# Patient Record
Sex: Female | Born: 1980 | Race: White | Hispanic: No | Marital: Married | Smoking: Never smoker
Health system: Southern US, Community
[De-identification: ages and names within clinical notes are randomized; demographics above are authoritative.]

## PROBLEM LIST (undated history)

## (undated) DIAGNOSIS — N911 Secondary amenorrhea: Secondary | ICD-10-CM

## (undated) DIAGNOSIS — N926 Irregular menstruation, unspecified: Secondary | ICD-10-CM

## (undated) DIAGNOSIS — A6000 Herpesviral infection of urogenital system, unspecified: Secondary | ICD-10-CM

## (undated) HISTORY — DX: Herpesviral infection of urogenital system, unspecified: A60.00

## (undated) HISTORY — PX: APPENDECTOMY: SHX54

---

## 2005-03-28 DIAGNOSIS — A6000 Herpesviral infection of urogenital system, unspecified: Secondary | ICD-10-CM

## 2005-03-28 HISTORY — DX: Herpesviral infection of urogenital system, unspecified: A60.00

## 2012-07-27 ENCOUNTER — Ambulatory Visit (INDEPENDENT_AMBULATORY_CARE_PROVIDER_SITE_OTHER): Payer: BC Managed Care – PPO | Admitting: Obstetrics & Gynecology

## 2012-07-27 ENCOUNTER — Other Ambulatory Visit: Payer: Self-pay | Admitting: Obstetrics & Gynecology

## 2012-07-27 ENCOUNTER — Encounter: Payer: Self-pay | Admitting: Obstetrics & Gynecology

## 2012-07-27 VITALS — BP 111/70 | HR 49 | Resp 14 | Ht 68.0 in | Wt 119.0 lb

## 2012-07-27 DIAGNOSIS — Z1329 Encounter for screening for other suspected endocrine disorder: Secondary | ICD-10-CM

## 2012-07-27 DIAGNOSIS — Z01419 Encounter for gynecological examination (general) (routine) without abnormal findings: Secondary | ICD-10-CM

## 2012-07-27 DIAGNOSIS — IMO0002 Reserved for concepts with insufficient information to code with codable children: Secondary | ICD-10-CM

## 2012-07-27 DIAGNOSIS — Z0000 Encounter for general adult medical examination without abnormal findings: Secondary | ICD-10-CM

## 2012-07-27 DIAGNOSIS — Z130 Encounter for screening for diseases of the blood and blood-forming organs and certain disorders involving the immune mechanism: Secondary | ICD-10-CM

## 2012-07-27 LAB — CBC WITH DIFFERENTIAL/PLATELET
Eosinophils Absolute: 0 10*3/uL (ref 0.0–0.7)
Hemoglobin: 13.3 g/dL (ref 12.0–15.0)
Lymphocytes Relative: 44 % (ref 12–46)
Lymphs Abs: 1.7 10*3/uL (ref 0.7–4.0)
MCH: 30.9 pg (ref 26.0–34.0)
Monocytes Relative: 9 % (ref 3–12)
Neutro Abs: 1.8 10*3/uL (ref 1.7–7.7)
Neutrophils Relative %: 45 % (ref 43–77)
Platelets: 206 10*3/uL (ref 150–400)
RBC: 4.3 MIL/uL (ref 3.87–5.11)
WBC: 3.8 10*3/uL — ABNORMAL LOW (ref 4.0–10.5)

## 2012-07-27 LAB — LIPID PANEL
Cholesterol: 125 mg/dL (ref 0–200)
HDL: 58 mg/dL (ref >39)
Total CHOL/HDL Ratio: 2.2 Ratio

## 2012-07-27 LAB — COMPREHENSIVE METABOLIC PANEL
Albumin: 4.2 g/dL (ref 3.5–5.2)
Alkaline Phosphatase: 44 U/L (ref 39–117)
BUN: 11 mg/dL (ref 6–23)
CO2: 33 mEq/L — ABNORMAL HIGH (ref 19–32)
Glucose, Bld: 87 mg/dL (ref 70–99)
Potassium: 4.3 mEq/L (ref 3.5–5.3)
Total Protein: 6.3 g/dL (ref 6.0–8.3)

## 2012-07-27 LAB — TSH: TSH: 1.149 u[IU]/mL (ref 0.350–4.500)

## 2012-07-27 MED ORDER — VALACYCLOVIR HCL 1 G PO TABS: 1000.0000 mg | ORAL_TABLET | Freq: Every day | ORAL | Status: DC

## 2012-07-27 NOTE — Progress Notes (Signed)
Subjective:     Gail Rhodes is a 32 y.o. female who presents for an annual exam. She wants to be pregnant and has been having unprotected sex for at least 10 months. Her periods are about 40 days apart and her husband was in a different location for 6 months. They are now in the same house. She would like valtrex and screening lipids and pap. The patient is sexually active. GYN screening history: last pap: was normal. The patient wears seatbelts: yes. The patient participates in regular exercise: yes. Has the patient ever been transfused or tattooed?: yes. The patient reports that there is not domestic violence in her life.   Menstrual History: OB History   Grav Para Term Preterm Abortions TAB SAB Ect Mult Living   0 0 0 0 0 0 0 0 0 0       Menarche age: 35  Patient's last menstrual period was 06/27/2012.    The following portions of the patient's history were reviewed and updated as appropriate: allergies, current medications, past family history, past medical history, past social history, past surgical history and problem list.  Review of Systems A comprehensive review of systems was negative. She has been married for 2/1/2 years, denies dyspareunia. She works for [employer], Insurance account manager. She weighed 175# in 2002.   Objective:    BP 111/70  Pulse 49  Resp 14  Ht 5\' 8"  (1.727 m)  Wt 119 lb (53.978 kg)  BMI 18.1 kg/m2  LMP 06/27/2012  General Appearance:    Alert, cooperative, no distress, appears stated age  Head:    Normocephalic, without obvious abnormality, atraumatic  Eyes:    PERRL, conjunctiva/corneas clear, EOM's intact, fundi    benign, both eyes  Ears:    Normal TM's and external ear canals, both ears  Nose:   Nares normal, septum midline, mucosa normal, no drainage    or sinus tenderness  Throat:   Lips, mucosa, and tongue normal; teeth and gums normal  Neck:   Supple, symmetrical, trachea midline, no adenopathy;    thyroid:  no enlargement/tenderness/nodules; no  carotid   bruit or JVD  Back:     Symmetric, no curvature, ROM normal, no CVA tenderness  Lungs:     Clear to auscultation bilaterally, respirations unlabored  Chest Wall:    No tenderness or deformity   Heart:    Regular rate and rhythm, S1 and S2 normal, no murmur, rub   or gallop  Breast Exam:    No tenderness, masses, or nipple abnormality  Abdomen:     Soft, non-tender, bowel sounds active all four quadrants,    no masses, no organomegaly  Genitalia:    Normal female without lesion, discharge or tenderness,     Extremities:   Extremities normal, atraumatic, no cyanosis or edema  Pulses:   2+ and symmetric all extremities  Skin:   Skin color, texture, turgor normal, no rashes or lesions  Lymph nodes:   Cervical, supraclavicular, and axillary nodes normal  Neurologic:   CNII-XII intact, normal strength, sensation and reflexes    throughout  .    Assessment:    Healthy female exam.  Desire for pregnancy   Plan:     Thin prep Pap smear.  Screening labs

## 2012-07-31 LAB — PAP IG, CT-NG NAA, HPV HIGH-RISK: HPV DNA High Risk: NOT DETECTED

## 2012-08-06 ENCOUNTER — Ambulatory Visit (INDEPENDENT_AMBULATORY_CARE_PROVIDER_SITE_OTHER): Payer: BC Managed Care – PPO | Admitting: Obstetrics & Gynecology

## 2012-08-06 ENCOUNTER — Encounter: Payer: Self-pay | Admitting: Obstetrics & Gynecology

## 2012-08-06 DIAGNOSIS — N970 Female infertility associated with anovulation: Secondary | ICD-10-CM

## 2012-08-06 MED ORDER — CLOMIPHENE CITRATE 50 MG PO TABS: 50.0000 mg | ORAL_TABLET | Freq: Every day | ORAL | Status: DC

## 2012-08-06 MED ORDER — ACYCLOVIR 200 MG PO CAPS: 200.0000 mg | ORAL_CAPSULE | Freq: Every day | ORAL | Status: DC

## 2012-08-06 MED ORDER — MEDROXYPROGESTERONE ACETATE 10 MG PO TABS: 10.0000 mg | ORAL_TABLET | Freq: Every day | ORAL | Status: DC

## 2012-08-06 NOTE — Progress Notes (Signed)
  Subjective:    Patient ID: Gail Rhodes, female    DOB: 04-04-1980, 32 y.o.   MRN: [id number]  HPI  Gail Rhodes is a 32 yo MW G0 who would like a pregnancy. Her cycles are now at least 40 days long. She is here to discuss clomid.  Review of Systems     Objective:   Physical Exam        Assessment & Plan:  oligoovulation- I have discussed and offered clomid. She is aware that there is an increased risk of twins. She declines a referral to a RE at this time. If no pregnancy in 3 months, I will check a semen analysis. She will take provera if she does not start a period this month (if a pregnancy test is negative).

## 2012-09-11 ENCOUNTER — Ambulatory Visit: Payer: BC Managed Care – PPO | Admitting: Obstetrics & Gynecology

## 2012-12-04 ENCOUNTER — Encounter: Payer: BC Managed Care – PPO | Admitting: Obstetrics & Gynecology

## 2012-12-04 ENCOUNTER — Emergency Department: Payer: Self-pay | Admitting: Emergency Medicine

## 2012-12-04 LAB — LIPASE, BLOOD: Lipase: 144 U/L (ref 73–393)

## 2012-12-04 LAB — URINALYSIS, COMPLETE
Bilirubin,UR: NEGATIVE
Glucose,UR: NEGATIVE mg/dL (ref 0–75)
Ketone: NEGATIVE
Leukocyte Esterase: NEGATIVE
Ph: 8 (ref 4.5–8.0)
RBC,UR: 2 /HPF (ref 0–5)
Squamous Epithelial: NONE SEEN
WBC UR: NONE SEEN /HPF (ref 0–5)

## 2012-12-04 LAB — COMPREHENSIVE METABOLIC PANEL
Anion Gap: 3 — ABNORMAL LOW (ref 7–16)
BUN: 9 mg/dL (ref 7–18)
Bilirubin,Total: 0.5 mg/dL (ref 0.2–1.0)
Chloride: 106 mmol/L (ref 98–107)
Co2: 30 mmol/L (ref 21–32)
Creatinine: 0.77 mg/dL (ref 0.60–1.30)
EGFR (African American): >60
Glucose: 88 mg/dL (ref 65–99)
Potassium: 4 mmol/L (ref 3.5–5.1)
SGOT(AST): 22 U/L (ref 15–37)
SGPT (ALT): 23 U/L (ref 12–78)

## 2012-12-04 LAB — CBC
HCT: 37.9 % (ref 35.0–47.0)
MCV: 94 fL (ref 80–100)
Platelet: 168 10*3/uL (ref 150–440)
RBC: 4.01 10*6/uL (ref 3.80–5.20)

## 2012-12-06 ENCOUNTER — Other Ambulatory Visit (INDEPENDENT_AMBULATORY_CARE_PROVIDER_SITE_OTHER): Payer: BC Managed Care – PPO | Admitting: *Deleted

## 2012-12-06 ENCOUNTER — Other Ambulatory Visit: Payer: BC Managed Care – PPO

## 2012-12-06 DIAGNOSIS — O021 Missed abortion: Secondary | ICD-10-CM

## 2012-12-06 NOTE — Progress Notes (Signed)
Patient was seen at [facility] with a HCG quant level of 22.  She is still actively bleeding and was diagnosed as having a miscarriage and advised to follow up here for follow up beta quants.  She was told she had a cyst on her left ovary and still feels some discomfort from this but does not wish to make an appointment with the physician at this time due to the cost of the appointments.  She will monitor how she feels and we will call her tomorrow with her hcg level to see if she needs further blood work.  Patient has several questions regarding when to try to begin to conceive.  I advised her we recommend to wait 3 cycles to ensure the uterine lining is thickened enough to support a pregnancy.  She does not want to wait and feels like the clomid may have caused her to have this miscarriage.  I reassured her that this was not so but she said she would consider whether or not to take it again and states that she does not wish to wait 3 months to begin trying to conceive again.  I stressed to her that not waiting does increase her chances of miscarriage.  An appointment with a physician was once again offered and patient declines.  She will await results and call back if symptoms change or worsen.

## 2012-12-07 LAB — HCG, QUANTITATIVE, PREGNANCY: hCG, Beta Chain, Quant, S: 14 m[IU]/mL

## 2012-12-19 ENCOUNTER — Other Ambulatory Visit (INDEPENDENT_AMBULATORY_CARE_PROVIDER_SITE_OTHER): Payer: BC Managed Care – PPO | Admitting: *Deleted

## 2012-12-19 DIAGNOSIS — O200 Threatened abortion: Secondary | ICD-10-CM

## 2012-12-20 LAB — HCG, QUANTITATIVE, PREGNANCY: hCG, Beta Chain, Quant, S: <2 m[IU]/mL

## 2013-01-21 ENCOUNTER — Telehealth: Payer: Self-pay | Admitting: *Deleted

## 2013-01-21 NOTE — Telephone Encounter (Signed)
Patient has questions regarding she is starting back on her clomid as her cycle has returned and she is ready to try to conceive again.  She is reassured and will call us as soon as she has a positive pregnancy test to be seen for labs, including progesterone levels.

## 2013-01-31 ENCOUNTER — Other Ambulatory Visit: Payer: Self-pay

## 2013-02-05 ENCOUNTER — Encounter: Payer: Self-pay | Admitting: Obstetrics & Gynecology

## 2013-02-12 ENCOUNTER — Institutional Professional Consult (permissible substitution): Payer: BC Managed Care – PPO | Admitting: Obstetrics & Gynecology

## 2013-02-14 NOTE — Telephone Encounter (Signed)
Patient will be unable to come in to be seen until a couple weeks after thanksgiving.  She wants to know if she can take one more round of the Clomid at 50mg  dose until she can get in to be seen. This is regulating her cycle well and she did get pregnant on the first round but miscarried.  She says she will call back to set up an appointment after thanksgiving.

## 2013-02-18 ENCOUNTER — Telehealth: Payer: Self-pay | Admitting: *Deleted

## 2013-02-18 MED ORDER — CLOMIPHENE CITRATE 50 MG PO TABS: 50.0000 mg | ORAL_TABLET | Freq: Every day | ORAL | Status: DC

## 2013-02-18 NOTE — Telephone Encounter (Signed)
Patient requests one refill of clomid.  Dr. Marice Potter authorized one more refill but will not authorize any further refills for patient.  She will need to come into the office to discuss further treatment if this cycle does not take.  Patient understands and will call back to schedule either new ob or infertility appointment.

## 2013-03-28 NOTE — L&D Delivery Note (Signed)
Delivery Note At 9:59 PM a viable female was delivered via Vaginal, Spontaneous Delivery (Presentation: Left Occiput Anterior).  APGAR: 9, 9; weight  .   Placenta status: Intact, Spontaneous.  Cord: 3 vessels with the following complications: None.  Cord pH: not sent  Anesthesia: Epidural  Episiotomy: None Lacerations:  Sec deg + bilat superficial periurethral lac Suture Repair: 3.0 vicryl rapide Est. Blood Loss (mL):  400  Mom to postpartum.  Baby to Couplet care / Skin to Skin.  [name],[name] M 02/11/2014, 10:26 PM

## 2013-04-15 ENCOUNTER — Other Ambulatory Visit (HOSPITAL_COMMUNITY): Payer: Self-pay | Admitting: Gynecology

## 2013-04-15 DIAGNOSIS — Z3141 Encounter for fertility testing: Secondary | ICD-10-CM

## 2013-04-23 ENCOUNTER — Ambulatory Visit (HOSPITAL_COMMUNITY)
Admission: RE | Admit: 2013-04-23 | Discharge: 2013-04-23 | Disposition: A | Discharge status: Home / Self Care | Payer: BC Managed Care – PPO | Source: Ambulatory Visit | Attending: Gynecology | Admitting: Gynecology

## 2013-04-23 DIAGNOSIS — Z3141 Encounter for fertility testing: Secondary | ICD-10-CM

## 2013-04-23 DIAGNOSIS — N979 Female infertility, unspecified: Secondary | ICD-10-CM | POA: Insufficient documentation

## 2013-04-23 MED ORDER — IOHEXOL 300 MG/ML  SOLN
10.0000 mL | Freq: Once | INTRAMUSCULAR | Status: AC | PRN
  Administered 2013-04-23: 7 mL

## 2013-07-16 LAB — OB RESULTS CONSOLE ABO/RH: RH TYPE: POSITIVE

## 2013-07-16 LAB — OB RESULTS CONSOLE HEPATITIS B SURFACE ANTIGEN: Hepatitis B Surface Ag: NEGATIVE

## 2013-07-16 LAB — OB RESULTS CONSOLE RPR: RPR: NONREACTIVE

## 2013-07-16 LAB — OB RESULTS CONSOLE GC/CHLAMYDIA
Chlamydia: NEGATIVE
GC PROBE AMP, GENITAL: NEGATIVE

## 2013-07-16 LAB — OB RESULTS CONSOLE RUBELLA ANTIBODY, IGM: Rubella: IMMUNE

## 2013-07-16 LAB — OB RESULTS CONSOLE ANTIBODY SCREEN: Antibody Screen: NEGATIVE

## 2013-07-16 LAB — OB RESULTS CONSOLE HIV ANTIBODY (ROUTINE TESTING): HIV: NONREACTIVE

## 2014-01-15 LAB — OB RESULTS CONSOLE GBS: GBS: NEGATIVE

## 2014-02-07 ENCOUNTER — Telehealth (HOSPITAL_COMMUNITY): Payer: Self-pay | Admitting: *Deleted

## 2014-02-07 ENCOUNTER — Encounter (HOSPITAL_COMMUNITY): Payer: Self-pay | Admitting: *Deleted

## 2014-02-07 NOTE — Telephone Encounter (Signed)
Preadmission screen  

## 2014-02-10 ENCOUNTER — Encounter (HOSPITAL_COMMUNITY): Payer: Self-pay

## 2014-02-10 ENCOUNTER — Inpatient Hospital Stay (HOSPITAL_COMMUNITY)
Admission: RE | Admit: 2014-02-10 | Discharge: 2014-02-13 | DRG: 775 | Disposition: A | Discharge status: Home / Self Care | Payer: BC Managed Care – PPO | Source: Ambulatory Visit | Attending: Obstetrics and Gynecology | Admitting: Obstetrics and Gynecology

## 2014-02-10 DIAGNOSIS — Z3A39 39 weeks gestation of pregnancy: Secondary | ICD-10-CM | POA: Diagnosis present

## 2014-02-10 DIAGNOSIS — Z3483 Encounter for supervision of other normal pregnancy, third trimester: Secondary | ICD-10-CM | POA: Diagnosis present

## 2014-02-10 DIAGNOSIS — IMO0002 Reserved for concepts with insufficient information to code with codable children: Secondary | ICD-10-CM | POA: Diagnosis present

## 2014-02-10 LAB — CBC
HCT: 32 % — ABNORMAL LOW (ref 36.0–46.0)
Hemoglobin: 11.3 g/dL — ABNORMAL LOW (ref 12.0–15.0)
MCH: 33.9 pg (ref 26.0–34.0)
MCHC: 35.3 g/dL (ref 30.0–36.0)
MCV: 96.1 fL (ref 78.0–100.0)
PLATELETS: 154 10*3/uL (ref 150–400)
RBC: 3.33 MIL/uL — ABNORMAL LOW (ref 3.87–5.11)
RDW: 12.3 % (ref 11.5–15.5)
WBC: 7.1 10*3/uL (ref 4.0–10.5)

## 2014-02-10 LAB — TYPE AND SCREEN
ABO/RH(D): O POS
Antibody Screen: NEGATIVE

## 2014-02-10 LAB — ABO/RH: ABO/RH(D): O POS

## 2014-02-10 MED ORDER — CITRIC ACID-SODIUM CITRATE 334-500 MG/5ML PO SOLN: 30.0000 mL | ORAL | Status: DC | PRN

## 2014-02-10 MED ORDER — LIDOCAINE HCL (PF) 1 % IJ SOLN
30.0000 mL | INTRAMUSCULAR | Status: DC | PRN
  Administered 2014-02-11: 30 mL via SUBCUTANEOUS
  Filled 2014-02-10: qty 30

## 2014-02-10 MED ORDER — OXYCODONE-ACETAMINOPHEN 5-325 MG PO TABS: 2.0000 | ORAL_TABLET | ORAL | Status: DC | PRN

## 2014-02-10 MED ORDER — OXYTOCIN BOLUS FROM INFUSION
500.0000 mL | INTRAVENOUS | Status: DC
  Administered 2014-02-11: 500 mL via INTRAVENOUS

## 2014-02-10 MED ORDER — ONDANSETRON HCL 4 MG/2ML IJ SOLN
4.0000 mg | Freq: Four times a day (QID) | INTRAMUSCULAR | Status: DC | PRN
  Administered 2014-02-11: 4 mg via INTRAVENOUS
  Filled 2014-02-10: qty 2

## 2014-02-10 MED ORDER — MISOPROSTOL 25 MCG QUARTER TABLET
25.0000 ug | ORAL_TABLET | ORAL | Status: DC | PRN
  Administered 2014-02-10 – 2014-02-11 (×2): 25 ug via VAGINAL
  Filled 2014-02-10 (×2): qty 0.25
  Filled 2014-02-10: qty 1

## 2014-02-10 MED ORDER — BUTORPHANOL TARTRATE 1 MG/ML IJ SOLN: 1.0000 mg | INTRAMUSCULAR | Status: DC | PRN

## 2014-02-10 MED ORDER — ACETAMINOPHEN 325 MG PO TABS
650.0000 mg | ORAL_TABLET | ORAL | Status: DC | PRN
  Administered 2014-02-11: 650 mg via ORAL
  Filled 2014-02-10: qty 2

## 2014-02-10 MED ORDER — LACTATED RINGERS IV SOLN
INTRAVENOUS | Status: DC
  Administered 2014-02-10 – 2014-02-11 (×4): via INTRAVENOUS

## 2014-02-10 MED ORDER — FLEET ENEMA 7-19 GM/118ML RE ENEM: 1.0000 | ENEMA | RECTAL | Status: DC | PRN

## 2014-02-10 MED ORDER — ZOLPIDEM TARTRATE 5 MG PO TABS
5.0000 mg | ORAL_TABLET | Freq: Every evening | ORAL | Status: DC | PRN
  Administered 2014-02-10: 5 mg via ORAL
  Filled 2014-02-10: qty 1

## 2014-02-10 MED ORDER — OXYCODONE-ACETAMINOPHEN 5-325 MG PO TABS: 1.0000 | ORAL_TABLET | ORAL | Status: DC | PRN

## 2014-02-10 MED ORDER — TERBUTALINE SULFATE 1 MG/ML IJ SOLN: 0.2500 mg | Freq: Once | INTRAMUSCULAR | Status: AC | PRN

## 2014-02-10 MED ORDER — OXYTOCIN 40 UNITS IN LACTATED RINGERS INFUSION - SIMPLE MED: 62.5000 mL/h | INTRAVENOUS | Status: DC

## 2014-02-10 MED ORDER — VALACYCLOVIR HCL 500 MG PO TABS
1000.0000 mg | ORAL_TABLET | Freq: Every day | ORAL | Status: DC
  Administered 2014-02-11 – 2014-02-13 (×3): 1000 mg via ORAL
  Filled 2014-02-10 (×5): qty 2

## 2014-02-10 MED ORDER — LACTATED RINGERS IV SOLN
500.0000 mL | INTRAVENOUS | Status: DC | PRN
Start: 2014-02-10 — End: 2014-02-13

## 2014-02-11 ENCOUNTER — Encounter (HOSPITAL_COMMUNITY): Payer: Self-pay

## 2014-02-11 ENCOUNTER — Inpatient Hospital Stay (HOSPITAL_COMMUNITY): Payer: BC Managed Care – PPO | Admitting: Anesthesiology

## 2014-02-11 LAB — RPR

## 2014-02-11 MED ORDER — LIDOCAINE HCL (PF) 1 % IJ SOLN
INTRAMUSCULAR | Status: DC | PRN
  Administered 2014-02-11 (×2): 5 mL

## 2014-02-11 MED ORDER — EPHEDRINE 5 MG/ML INJ
10.0000 mg | INTRAVENOUS | Status: DC | PRN
  Filled 2014-02-11: qty 2

## 2014-02-11 MED ORDER — OXYTOCIN 40 UNITS IN LACTATED RINGERS INFUSION - SIMPLE MED
1.0000 m[IU]/min | INTRAVENOUS | Status: DC
  Administered 2014-02-11: 1 m[IU]/min via INTRAVENOUS
  Filled 2014-02-11: qty 1000

## 2014-02-11 MED ORDER — OXYTOCIN 40 UNITS IN LACTATED RINGERS INFUSION - SIMPLE MED
1.0000 m[IU]/min | INTRAVENOUS | Status: DC
  Administered 2014-02-11: 6 m[IU]/min via INTRAVENOUS

## 2014-02-11 MED ORDER — DIPHENHYDRAMINE HCL 50 MG/ML IJ SOLN: 12.5000 mg | INTRAMUSCULAR | Status: DC | PRN

## 2014-02-11 MED ORDER — PHENYLEPHRINE 40 MCG/ML (10ML) SYRINGE FOR IV PUSH (FOR BLOOD PRESSURE SUPPORT)
80.0000 ug | PREFILLED_SYRINGE | INTRAVENOUS | Status: DC | PRN
  Filled 2014-02-11: qty 2
  Filled 2014-02-11: qty 10

## 2014-02-11 MED ORDER — INFLUENZA VAC SPLIT QUAD 0.5 ML IM SUSY
0.5000 mL | PREFILLED_SYRINGE | INTRAMUSCULAR | Status: DC
  Filled 2014-02-11: qty 0.5

## 2014-02-11 MED ORDER — FENTANYL 2.5 MCG/ML BUPIVACAINE 1/10 % EPIDURAL INFUSION (WH - ANES)
14.0000 mL/h | INTRAMUSCULAR | Status: DC | PRN
  Administered 2014-02-11 (×2): 14 mL/h via EPIDURAL
  Filled 2014-02-11 (×2): qty 125

## 2014-02-11 MED ORDER — LACTATED RINGERS IV SOLN
500.0000 mL | Freq: Once | INTRAVENOUS | Status: AC
  Administered 2014-02-11: 500 mL via INTRAVENOUS

## 2014-02-11 MED ORDER — PHENYLEPHRINE 40 MCG/ML (10ML) SYRINGE FOR IV PUSH (FOR BLOOD PRESSURE SUPPORT)
80.0000 ug | PREFILLED_SYRINGE | INTRAVENOUS | Status: DC | PRN
  Filled 2014-02-11: qty 2

## 2014-02-11 NOTE — Progress Notes (Signed)
comft w/ epid>now 3/75%, FHR cat 1

## 2014-02-11 NOTE — Progress Notes (Signed)
Now 1-2 /50  AROM>>>clr, FHR cat 1, pit per protocol

## 2014-02-11 NOTE — H&P (Signed)
NAMZannie Rhodes:  Mohr, Nihal               ACCOUNT NO.:  [id number]  MEDICAL RECORD NO.:  [id number]  LOCATION:                                 FACILITY:  PHYSICIAN:  Duke Salviaichard M. Marcelle OverlieHolland, M.D.DATE OF BIRTH:  [birth year]  DATE OF ADMISSION: DATE OF DISCHARGE:                             HISTORY & PHYSICAL   CHIEF COMPLAINT:  SGA at term.  HISTORY OF PRESENT ILLNESS:  A 33 year old, G 2, P 0-0-1-0, EDD February 14, 2014, this patient was seen by Dr. Vincente PoliGrewal, on February 05, 2014. Ultrasound at that time, showed size less than dates.  BPP 8/8.  AFI was normal.  Vertex; however, EFW was at the 18th percentile with some increased calcifications of the placenta.  Two-stage labor induction scheduled per Dr. Vincente PoliGrewal.  The patient's GBS screen was negative.  A 1- hour GTT was 94.  This was a Clomid/IU pregnancy.  She has a remote history of HSV.  No outbreaks of this pregnancy has been on daily Valtrex prophylaxis.  The procedure of induction including a priming with Cytotec, Pitocin, rupture membranes when applicable all discussed with her.  All of her questions were answered.  PAST MEDICAL HISTORY:  Please see her [facility] form for details of her past medical history.  ALLERGIES:  None.  PHYSICAL EXAMINATION:  VITAL SIGNS:  Temperature 98.2, blood pressure 120/72. HEENT:  Unremarkable. NECK:  Supple without masses. LUNGS:  Clear. CARDIOVASCULAR:  Regular rate and rhythm without murmurs, rubs, gallops. BREASTS:  Not examined.  A 34 cm fundal height.  Fetal heart rate 140. Cervix was 1.5, 50% -2 vertex membranes intact. EXTREMITIES:  Neurologic exam unremarkable.  IMPRESSION: 1. A 39+ week intrauterine pregnancy. 2. Suspected small for gestational age.  PLAN:  Two-stage labor induction, procedure, and risks discussed as above.     Trexton Escamilla M. Marcelle OverlieHolland, M.D.     RMH/MEDQ  D:  02/10/2014  T:  02/10/2014  Job:  [id number]

## 2014-02-11 NOTE — H&P (Signed)
Danella Pentonmily J Bischoff  DICTATION #  CSN# [id number]   [name],Bryant Lipps M, MD 02/11/2014 7:37 AM

## 2014-02-11 NOTE — Anesthesia Preprocedure Evaluation (Signed)

## 2014-02-11 NOTE — Plan of Care (Signed)
Problem: Phase I Progression Outcomes Goal: Assess per MD/Nurse,Routine-VS,FHR,UC,Head to Toe assess Outcome: Completed/Met Date Met:  02/11/14 Goal: Obtain and review prenatal records Outcome: Completed/Met Date Met:  02/11/14 Goal: Pain controlled with appropriate interventions Outcome: Completed/Met Date Met:  02/11/14 Goal: OOB as tolerated unless otherwise ordered Outcome: Completed/Met Date Met:  02/11/14 Goal: Tolerating diet Outcome: Completed/Met Date Met:  02/11/14 Goal: Adequate progression of labor Outcome: Completed/Met Date Met:  02/11/14 Goal: Medications/IV Fluids N/A Outcome: Completed/Met Date Met:  02/11/14 Goal: Induction meds as ordered Outcome: Completed/Met Date Met:  02/11/14 Goal: IV Pain medications as ordered Outcome: Not Applicable Date Met:  91/50/56 Goal: Pitocin as ordered Outcome: Completed/Met Date Met:  02/11/14 Goal: FHR checked 5 minutes after meds (ROM) Rupture of Membranes Outcome: Completed/Met Date Met:  02/11/14 Goal: Assess/evaluate labor progress and adequacy Outcome: Completed/Met Date Met:  02/11/14

## 2014-02-11 NOTE — Anesthesia Procedure Notes (Signed)
Epidural Patient location during procedure: OB Start time: 02/11/2014 10:51 AM  Staffing Anesthesiologist: Brayton CavesJACKSON, Marcey Persad Performed by: anesthesiologist   Preanesthetic Checklist Completed: patient identified, site marked, surgical consent, pre-op evaluation, timeout performed, IV checked, risks and benefits discussed and monitors and equipment checked  Epidural Patient position: sitting Prep: site prepped and draped and DuraPrep Patient monitoring: continuous pulse ox and blood pressure Approach: midline Location: L3-L4 Injection technique: LOR air  Needle:  Needle type: Tuohy  Needle gauge: 17 G Needle length: 9 cm and 9 Needle insertion depth: 5 cm cm Catheter type: closed end flexible Catheter size: 19 Gauge Catheter at skin depth: 10 cm Test dose: negative  Assessment Events: blood not aspirated, injection not painful, no injection resistance, negative IV test and no paresthesia  Additional Notes Patient identified.  Risk benefits discussed including failed block, incomplete pain control, headache, nerve damage, paralysis, blood pressure changes, nausea, vomiting, reactions to medication both toxic or allergic, and postpartum back pain.  Patient expressed understanding and wished to proceed.  All questions were answered.  Sterile technique used throughout procedure and epidural site dressed with sterile barrier dressing. No paresthesia or other complications noted.The patient did not experience any signs of intravascular injection such as tinnitus or metallic taste in mouth nor signs of intrathecal spread such as rapid motor block. Please see nursing notes for vital signs.

## 2014-02-12 LAB — CBC
HCT: 29.1 % — ABNORMAL LOW (ref 36.0–46.0)
HEMOGLOBIN: 10.3 g/dL — AB (ref 12.0–15.0)
MCH: 34.3 pg — AB (ref 26.0–34.0)
MCHC: 35.4 g/dL (ref 30.0–36.0)
MCV: 97 fL (ref 78.0–100.0)
Platelets: 144 10*3/uL — ABNORMAL LOW (ref 150–400)
RBC: 3 MIL/uL — ABNORMAL LOW (ref 3.87–5.11)
RDW: 12.3 % (ref 11.5–15.5)
WBC: 13.5 10*3/uL — ABNORMAL HIGH (ref 4.0–10.5)

## 2014-02-12 MED ORDER — ONDANSETRON HCL 4 MG PO TABS: 4.0000 mg | ORAL_TABLET | ORAL | Status: DC | PRN

## 2014-02-12 MED ORDER — SIMETHICONE 80 MG PO CHEW: 80.0000 mg | CHEWABLE_TABLET | ORAL | Status: DC | PRN

## 2014-02-12 MED ORDER — IBUPROFEN 800 MG PO TABS
800.0000 mg | ORAL_TABLET | Freq: Three times a day (TID) | ORAL | Status: DC | PRN
  Administered 2014-02-12 – 2014-02-13 (×4): 800 mg via ORAL
  Filled 2014-02-12 (×5): qty 1

## 2014-02-12 MED ORDER — DIPHENHYDRAMINE HCL 25 MG PO CAPS: 25.0000 mg | ORAL_CAPSULE | Freq: Four times a day (QID) | ORAL | Status: DC | PRN

## 2014-02-12 MED ORDER — BENZOCAINE-MENTHOL 20-0.5 % EX AERO
1.0000 "application " | INHALATION_SPRAY | CUTANEOUS | Status: DC | PRN
  Administered 2014-02-12: 1 via TOPICAL
  Filled 2014-02-12: qty 56

## 2014-02-12 MED ORDER — ZOLPIDEM TARTRATE 5 MG PO TABS: 5.0000 mg | ORAL_TABLET | Freq: Every evening | ORAL | Status: DC | PRN

## 2014-02-12 MED ORDER — OXYCODONE-ACETAMINOPHEN 5-325 MG PO TABS
1.0000 | ORAL_TABLET | ORAL | Status: DC | PRN
  Administered 2014-02-12 (×2): 1 via ORAL
  Filled 2014-02-12 (×3): qty 1

## 2014-02-12 MED ORDER — LANOLIN HYDROUS EX OINT: TOPICAL_OINTMENT | CUTANEOUS | Status: DC | PRN

## 2014-02-12 MED ORDER — OXYCODONE-ACETAMINOPHEN 5-325 MG PO TABS: 2.0000 | ORAL_TABLET | ORAL | Status: DC | PRN

## 2014-02-12 MED ORDER — SENNOSIDES-DOCUSATE SODIUM 8.6-50 MG PO TABS
2.0000 | ORAL_TABLET | ORAL | Status: DC
  Administered 2014-02-12 (×2): 2 via ORAL
  Filled 2014-02-12 (×2): qty 2

## 2014-02-12 MED ORDER — WITCH HAZEL-GLYCERIN EX PADS: 1.0000 "application " | MEDICATED_PAD | CUTANEOUS | Status: DC | PRN

## 2014-02-12 MED ORDER — MEASLES, MUMPS & RUBELLA VAC ~~LOC~~ INJ
0.5000 mL | INJECTION | Freq: Once | SUBCUTANEOUS | Status: DC
  Filled 2014-02-12: qty 0.5

## 2014-02-12 MED ORDER — FLEET ENEMA 7-19 GM/118ML RE ENEM: 1.0000 | ENEMA | Freq: Every day | RECTAL | Status: DC | PRN

## 2014-02-12 MED ORDER — DIBUCAINE 1 % RE OINT
1.0000 | TOPICAL_OINTMENT | RECTAL | Status: DC | PRN
Start: 2014-02-12 — End: 2014-02-13

## 2014-02-12 MED ORDER — INFLUENZA VAC SPLIT QUAD 0.5 ML IM SUSY
0.5000 mL | PREFILLED_SYRINGE | INTRAMUSCULAR | Status: AC
  Administered 2014-02-13: 0.5 mL via INTRAMUSCULAR
  Filled 2014-02-12: qty 0.5

## 2014-02-12 MED ORDER — TETANUS-DIPHTH-ACELL PERTUSSIS 5-2.5-18.5 LF-MCG/0.5 IM SUSP: 0.5000 mL | Freq: Once | INTRAMUSCULAR | Status: DC

## 2014-02-12 MED ORDER — ONDANSETRON HCL 4 MG/2ML IJ SOLN: 4.0000 mg | INTRAMUSCULAR | Status: DC | PRN

## 2014-02-12 MED ORDER — BISACODYL 10 MG RE SUPP: 10.0000 mg | Freq: Every day | RECTAL | Status: DC | PRN

## 2014-02-12 MED ORDER — PRENATAL MULTIVITAMIN CH
1.0000 | ORAL_TABLET | Freq: Every day | ORAL | Status: DC
  Administered 2014-02-12: 1 via ORAL
  Filled 2014-02-12: qty 1

## 2014-02-12 NOTE — Lactation Note (Signed)
This note was copied from the chart of Boy Zannie Kehrmily Wenzler. Lactation Consultation Note  Initial visit done. Breastfeeding consultation services and support information reviewed and left with patient.  Mom is currently nursing baby in laid back position and baby nursing actively.  Reviewed basics including feeding with cues, waking techniques, breast massage and obtaining a wide latch.  Answered questions.  Encouraged to call with concerns/assist prn.  Patient Name: Boy Zannie Kehrmily Lasker ZOXWR'UToday's Date: 02/12/2014 Reason for consult: Initial assessment   Maternal Data    Feeding Feeding Type: Breast Fed Length of feed:  (baby has been sleeping mom did not want to wake baby)  LATCH Score/Interventions Latch: Repeated attempts needed to sustain latch, nipple held in mouth throughout feeding, stimulation needed to elicit sucking reflex. Intervention(s): Assist with latch;Adjust position  Audible Swallowing: A few with stimulation Intervention(s): Skin to skin  Type of Nipple: Everted at rest and after stimulation  Comfort (Breast/Nipple): Soft / non-tender     Hold (Positioning): Assistance needed to correctly position infant at breast and maintain latch.  LATCH Score: 7  Lactation Tools Discussed/Used     Consult Status Consult Status: Follow-up Date: 02/13/14 Follow-up type: In-patient    Huston FoleyMOULDEN, Sheriann Newmann S 02/12/2014, 4:01 PM

## 2014-02-12 NOTE — Progress Notes (Signed)
Post Partum Day 1 Subjective: no complaints, up ad lib, voiding and tolerating PO  Objective: Blood pressure 117/63, pulse 53, temperature 98 F (36.7 C), temperature source Oral, resp. rate 20, height 5\' 8"  (1.727 m), weight 145 lb (65.772 kg), last menstrual period 05/10/2013, SpO2 100 %, unknown if currently breastfeeding.  Physical Exam:  General: alert and cooperative Lochia: appropriate Uterine Fundus: firm Incision: perineum intact DVT Evaluation: No evidence of DVT seen on physical exam. Negative Homan's sign. No cords or calf tenderness. No significant calf/ankle edema.   Recent Labs  02/10/14 2025 02/12/14 0620  HGB 11.3* 10.3*  HCT 32.0* 29.1*    Assessment/Plan: Plan for discharge tomorrow and Circumcision prior to discharge   LOS: 2 days   CURTIS,CAROL G 02/12/2014, 7:58 AM

## 2014-02-12 NOTE — Plan of Care (Signed)
Problem: Consults Goal: Nutrition Consult-if indicated Outcome: Not Applicable Date Met:  76/54/65  Problem: Phase I Progression Outcomes Goal: Pain controlled with appropriate interventions Outcome: Completed/Met Date Met:  02/12/14 Goal: OOB as tolerated unless otherwise ordered Outcome: Completed/Met Date Met:  02/12/14 Goal: VS, stable, temp < 100.4 degrees F Outcome: Completed/Met Date Met:  02/12/14 Goal: Other Phase I Outcomes/Goals Outcome: Not Applicable Date Met:  03/54/65  Problem: Phase II Progression Outcomes Goal: Pain controlled on oral analgesia Outcome: Completed/Met Date Met:  02/12/14 Goal: Progress activity as tolerated unless otherwise ordered Outcome: Completed/Met Date Met:  02/12/14 Goal: Afebrile, VS remain stable Outcome: Completed/Met Date Met:  02/12/14 Goal: Incision intact & without signs/symptoms of infection Outcome: Not Applicable Date Met:  68/12/75 Goal: Rh isoimmunization per orders Outcome: Not Applicable Date Met:  17/00/17 Goal: Tolerating diet Outcome: Completed/Met Date Met:  02/12/14 Goal: Other Phase II Outcomes/Goals Outcome: Not Applicable Date Met:  49/44/96

## 2014-02-12 NOTE — Progress Notes (Signed)
CSW received consult as MOB had requested home visit once discharged from the hospital.   CSW spoke with RN, who stated that MOB did not present with any other concerns for a CSW consult.  RN noted that MOB will be able to discuss her desire for home visit with the RN who offers this service prior to discharge.   CSW screened out referral due to other member of the treatment team able to meet MOB's needs.   Re-consult CSW as needs arise.  

## 2014-02-12 NOTE — Plan of Care (Signed)
Problem: Consults Goal: Postpartum Patient Education (See Patient Education module for education specifics.) Outcome: Completed/Met Date Met:  02/12/14  Problem: Phase I Progression Outcomes Goal: Voiding adequately Outcome: Completed/Met Date Met:  02/12/14 Goal: Foley catheter patent Outcome: Not Applicable Date Met:  22/33/61 Goal: OOB as tolerated unless otherwise ordered Outcome: Progressing Goal: IS, TCDB as ordered Outcome: Not Applicable Date Met:  22/44/97 Goal: Initial discharge plan identified Outcome: Completed/Met Date Met:  02/12/14

## 2014-02-12 NOTE — Progress Notes (Addendum)
Attempted to get patient up to bathroom via stedy x2; patient able to sit up on side of bed states she feels "a little dizzy" but can get up; get patient to bathroom and patient starts to become pale and diaphoretic; gave patient juice and got patient back to bed with bolus given; patient states the dizziness occurs often after she lays down for a long period of time along with a drop in her blood pressure. Blood pressure returns to baseline after fluid bolus and patient is back to bed.

## 2014-02-12 NOTE — Anesthesia Postprocedure Evaluation (Signed)
Anesthesia Post Note  Patient: Gail Rhodes  Procedure(s) Performed: * No procedures listed *  Anesthesia type: Epidural  Patient location: Mother/Baby  Post pain: Pain level controlled  Post assessment: Post-op Vital signs reviewed  Last Vitals:  Filed Vitals:   02/12/14 0500  BP: 117/63  Pulse: 53  Temp: 36.7 C  Resp: 20    Post vital signs: Reviewed  Level of consciousness:alert  Complications: No apparent anesthesia complications

## 2014-02-13 ENCOUNTER — Encounter (HOSPITAL_COMMUNITY): Payer: Self-pay

## 2014-02-13 MED ORDER — OXYCODONE-ACETAMINOPHEN 5-325 MG PO TABS: 1.0000 | ORAL_TABLET | ORAL | Status: AC | PRN

## 2014-02-13 MED ORDER — IBUPROFEN 800 MG PO TABS: 800.0000 mg | ORAL_TABLET | Freq: Three times a day (TID) | ORAL | Status: AC | PRN

## 2014-02-13 NOTE — Plan of Care (Signed)
Problem: Discharge Progression Outcomes Goal: Barriers To Progression Addressed/Resolved Outcome: Not Applicable Date Met:  41/42/39 Goal: Activity appropriate for discharge plan Outcome: Completed/Met Date Met:  02/13/14 Goal: Tolerating diet Outcome: Completed/Met Date Met:  53/20/23 Goal: Complications resolved/controlled Outcome: Completed/Met Date Met:  02/13/14 Goal: Pain controlled with appropriate interventions Outcome: Completed/Met Date Met:  02/13/14 Goal: Afebrile, VS remain stable at discharge Outcome: Completed/Met Date Met:  02/13/14 Goal: Remove staples per MD order Outcome: Not Applicable Date Met:  34/35/68 Goal: MMR given as ordered Outcome: Not Applicable Date Met:  61/68/37 Goal: Discharge plan in place and appropriate Outcome: Completed/Met Date Met:  02/13/14 Goal: Other Discharge Outcomes/Goals Outcome: Not Applicable Date Met:  29/02/11

## 2014-02-13 NOTE — Discharge Summary (Signed)
Obstetric Discharge Summary Reason for Admission: induction of labor Prenatal Procedures: ultrasound Intrapartum Procedures: spontaneous vaginal delivery Postpartum Procedures: none Complications-Operative and Postpartum: 2 degree perineal laceration HEMOGLOBIN  Date Value Ref Range Status  02/12/2014 10.3* 12.0 - 15.0 g/dL Final   HCT  Date Value Ref Range Status  02/12/2014 29.1* 36.0 - 46.0 % Final    Physical Exam:  General: alert and cooperative Lochia: appropriate Uterine Fundus: firm Incision: healing well DVT Evaluation: No evidence of DVT seen on physical exam. Negative Homan's sign. No cords or calf tenderness. No significant calf/ankle edema.  Discharge Diagnoses: Term Pregnancy-delivered  Discharge Information: Date: 02/13/2014 Activity: pelvic rest Diet: routine Medications: PNV, Ibuprofen and Percocet Condition: stable Instructions: refer to practice specific booklet Discharge to: home   Newborn Data: Live born female  Birth Weight: 6 lb 7 oz (2920 g) APGAR: 9, 9  Home with mother.  Gail Rhodes G 02/13/2014, 8:09 AM

## 2014-02-13 NOTE — Lactation Note (Signed)
This note was copied from the chart of Boy Zannie Kehrmily Mcswain. Lactation Consultation Note: Follow up visit with mom before DC. Mom has baby latched to breast when I went into room. Mom reports nipples are a little tender- has comfort gels. Reviewed engorgement prevention and treatment. No questions at present. Reviewed BFSG and OP appointments as resources for support after DC.To call prn  Patient Name: Boy Zannie Kehrmily Dilday ZOXWR'UToday's Date: 02/13/2014 Reason for consult: Follow-up assessment   Maternal Data Formula Feeding for Exclusion: No Has patient been taught Hand Expression?: Yes Does the patient have breastfeeding experience prior to this delivery?: No  Feeding Feeding Type: Breast Fed  LATCH Score/Interventions Latch: Grasps breast easily, tongue down, lips flanged, rhythmical sucking.  Audible Swallowing: A few with stimulation  Type of Nipple: Everted at rest and after stimulation  Comfort (Breast/Nipple): Filling, red/small blisters or bruises, mild/mod discomfort  Problem noted: Mild/Moderate discomfort Interventions (Mild/moderate discomfort): Comfort gels  Hold (Positioning): No assistance needed to correctly position infant at breast. Intervention(s): Breastfeeding basics reviewed  LATCH Score: 8  Lactation Tools Discussed/Used     Consult Status Consult Status: Complete    Pamelia HoitWeeks, Dyesha Henault D 02/13/2014, 8:14 AM

## 2014-02-14 ENCOUNTER — Inpatient Hospital Stay (HOSPITAL_COMMUNITY)
Admission: AD | Admit: 2014-02-14 | Payer: BC Managed Care – PPO | Source: Ambulatory Visit | Admitting: Obstetrics and Gynecology

## 2014-11-15 IMAGING — RF DG HYSTEROGRAM
6 series · 6 of 6 positions shown · non-contrast
Comparison: None

CLINICAL DATA: Infertility workup

EXAM:
HYSTEROSALPINGOGRAM
TECHNIQUE: Hysterosalpingogram was performed by the ordering physician under
fluoroscopy. Fluoroscopic images were submitted for radiologic
interpretation following the procedure. Please see the procedural
report for the amount of contrast and the fluoroscopy time utilized.

[Series 1: run · 1 of 1 slices shown (1 of 6)]
[im 1/1]
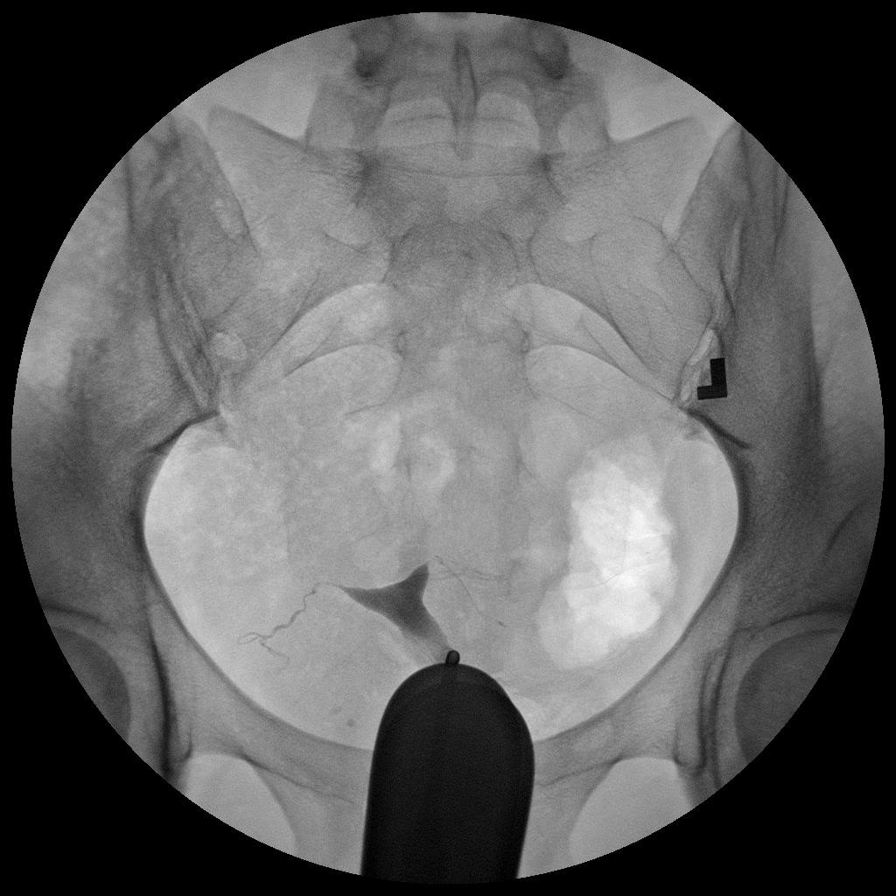

[Series 2: run · 1 of 1 slices shown (2 of 6)]
[im 1/1]
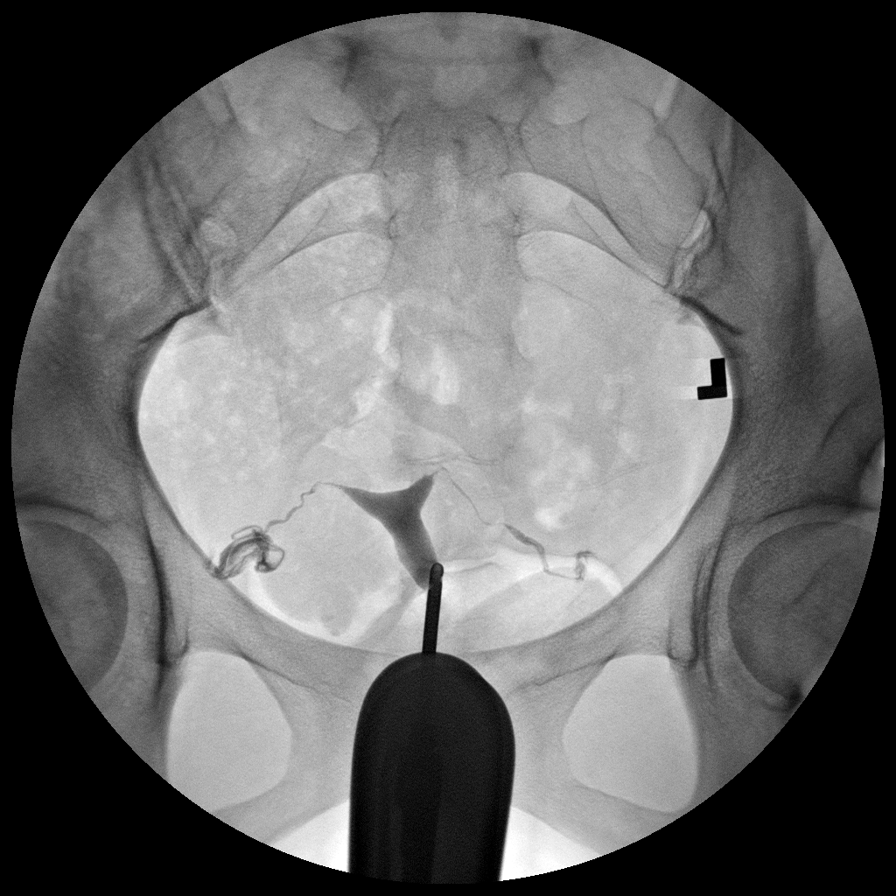

[Series 3: run · 1 of 1 slices shown (3 of 6)]
[im 1/1]
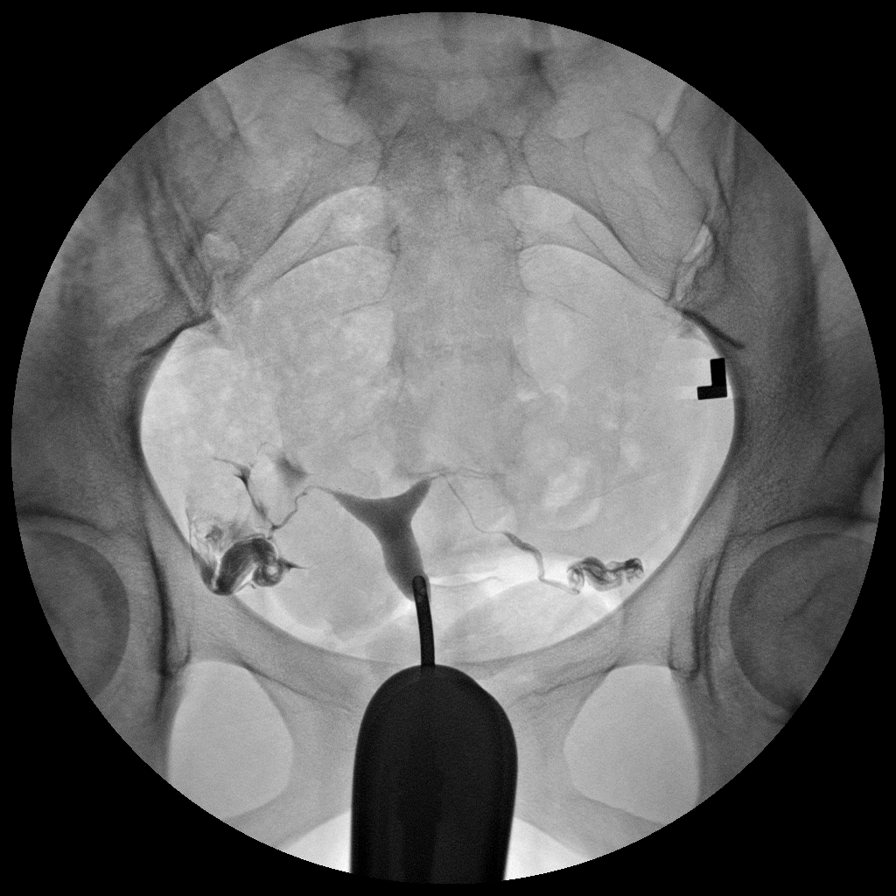

[Series 4: run · 1 of 1 slices shown (4 of 6)]
[im 1/1]
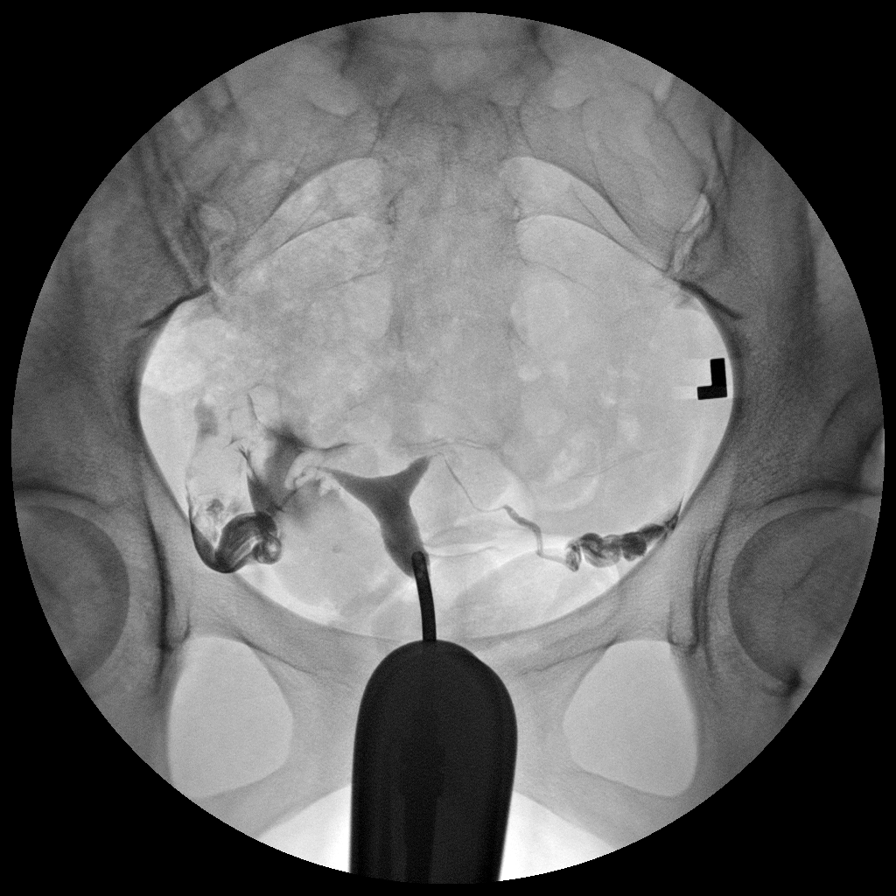

[Series 5: run · 1 of 1 slices shown (5 of 6)]
[im 1/1]
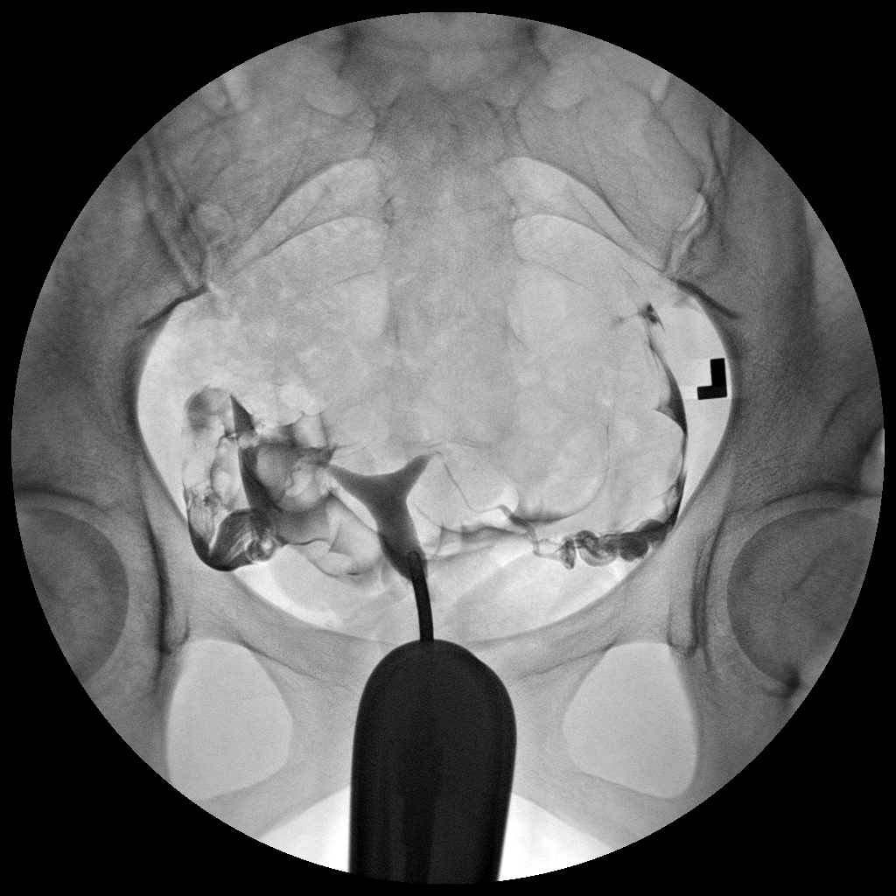

[Series 6: run · 1 of 1 slices shown (6 of 6)]
[im 1/1]
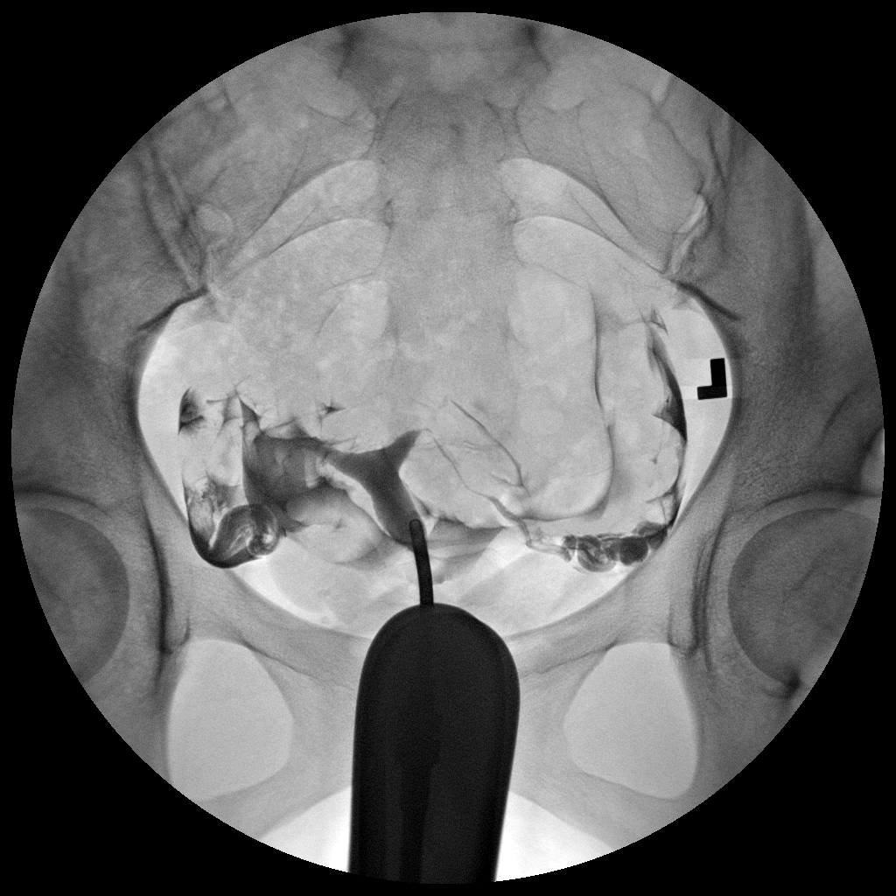

[6 of 6 positions shown; findings below may reference images not displayed]

FINDINGS: The endometrial cavity is normal in appearance and contour. No signs
of mullerian duct anomaly.

Opacification of both fallopian tubes is seen. Both tubes appear
normal. Intraperitoneal spill of contrast from both fallopian tubes
is demonstrated.
IMPRESSION: Normal study. Both fallopian tubes are patent.

## 2017-03-28 NOTE — L&D Delivery Note (Signed)
Mother's Information    Labor Events    Preterm labor?:  No  Rupture type:  Artificial=AROM  Fluid color:  Clear  Fluid odor:  None     Mother Delivery Information    Episiotomy:  None  Lacerations:  1st  # of Repair Packets:  1  Vaginal Delivery Est. Blood Loss (mL):  300  Surgical or Additional Est. Blood Loss (mL):  0 (View Only):  Edit in Flowsheets   Combined Est. Blood Loss (mL):  300        Aguillard, Baby Girl Irving Burtonmily [phone][[id number]]    Labor Events    Preterm labor?:  No  Antenatal steroids?:  None  Cervical ripening date/time:     Antibiotics received during labor?:  No  Rupture Identifier:  Sac 1   Rupture date/time: 09/17/17 07:30:00   Rupture type:  Artificial=AROM  Fluid color:  Clear  Fluid odor:  None  Induction:  Oxytocin  Indications for induction:  Intrauterine Growth Restriction (IUGR), Oligohydramnios  Augmentation:  AROM  Indications for augmentation:  Ineffective Contraction Pattern  Labor complications:  None          Labor Event Times    Labor onset date/time:     Dilation complete date/time:   09/17/17 1800 EDT   Start pushing: 09/17/2017 1828   Decision time (emergent c-section):       Anesthesia    Method:  Epidural     Assisted Delivery Details    Forceps attempted?:  No  Vacuum extractor attempted?:  No     Document Additional Attempt       Document Additional Attempt             Shoulder Dystocia    Shoulder dystocia present?:  No  Add Second Maneuver  Add Marketing executiveThird Maneuver  Add Fourth Maneuver  Add Fifth Maneuver  Add Sixth Maneuver  Add Seventh Maneuver  Add Eighth Maneuver  Add Ninth Maneuver     Newborn Presentation    Presentation:  Vertex  _:  Occiput  _:  Anterior     Newborn Information    Head delivery date/time:  09/17/2017 18:28:00   Changing the newborn's delivery date/time could affect patient care.:     Delivery date/time:   09/17/17 1828   Delivery type:  Vaginal, Spontaneous   C-Section Details:         Delivery Providers    Delivering clinician:  Simonne MartinetSusan Michelle Saad Buhl, APRN - CNM    Provider Role    Raj JanusBrittany M Sattler, RN Registered Nurse    Shellia CleverlyJessica R Leopold, RN Registered Nurse      Cord    Vessels:  3 Vessels  Complications:  Nuchal  Nuchal intervention:  reduced  Delayed cord clamping?:  Yes  Cord clamped date/time:  09/17/2017 1830  Cord blood disposition:  Lab  Gases sent?:  No     Placenta    Date/time:  09/17/2017 18:28:00  Removal:  Spontaneous  Appearance:  Intact  Disposition:  Discarded     Delivery Resuscitation    Method:  Stimulation, Bulb Suction     Apgars    Living status:  Living  Apgars   1 Minute:   5 Minute:   10 Minute 15 Minute 20 Minute   Skin Color: 1  1       Heart Rate: 2  2       Reflex Irritability: 2  2       Muscle Tone: 2  2  Respiratory Effort: 2  2       Total: 9  9               Apgars Assigned By:  B. SATTLER,RN     Skin to Skin    Skin to skin initiation date/time: 09/17/17 18:34:00   Skin to skin with:  Mother  Skin to skin end date/time:        Newborn Measurements    ID band #:  [id number]       Delivery Information    Episiotomy:  None  Perineal lacerations:  1st Repaired:  Yes   Vaginal laceration:  No    Cervical laceration:  No    Vaginal delivery est. blood loss (mL):  300  Surgical or additional est. blood loss (mL):  0 (View Only):  Edit in Flowsheets   Combined est. blood loss (mL):  300     Vaginal Delivery Counts    Initial count personnel:  B. SATTLER  Initial count verified by:  Michaelle Copas   4x4:   Needles:   Instruments:   Lap Pads:   Sponges:     Initial counts:          Final counts:          Final count personnel:  S. Rama Sorci  Final count verified by:  Ronita Hipps  Accurate final count?:  Yes  Final vaginal sweep completed:  Yes     Other Procedures    Procedures:  None

## 2017-03-31 LAB — RPR, EXTERNAL RESULT: RPR, External Result: NONREACTIVE

## 2017-03-31 LAB — HIV, EXTERNAL RESULT: HIV, External Result: NEGATIVE

## 2017-03-31 LAB — RHOGAM, EXTERNAL RESULT: Rhogam, External Result: O POS

## 2017-04-03 LAB — C. TRACHOMATIS, EXTERNAL RESULT: C. Trachomatis, External Result: NEGATIVE

## 2017-04-03 LAB — N. GONORRHOEAE, EXTERNAL RESULT: N. Gonorrhoeae, External Result: POSITIVE

## 2017-07-24 ENCOUNTER — Inpatient Hospital Stay: Payer: PRIVATE HEALTH INSURANCE

## 2017-07-24 ENCOUNTER — Other Ambulatory Visit: Admit: 2017-07-24 | Discharge: 2017-07-24 | Payer: PRIVATE HEALTH INSURANCE

## 2017-07-24 DIAGNOSIS — N912 Amenorrhea, unspecified: Secondary | ICD-10-CM

## 2017-07-24 DIAGNOSIS — Z3480 Encounter for supervision of other normal pregnancy, unspecified trimester: Secondary | ICD-10-CM

## 2017-07-24 LAB — URINE DRUG SCREEN, COMPREHENSIVE
Amphetamine Screen, Ur: NEGATIVE
Barbiturate Screen, Ur: NEGATIVE
Benzodiazepine Screen, Urine: NEGATIVE
Buprenorphine Urine: NEGATIVE
Cannabinoid Scrn, Ur: NEGATIVE
Cocaine Metabolite, Urine: NEGATIVE
Methadone Screen, Urine: NEGATIVE
Methamphetamine, Urine: NEGATIVE
Opiates, Urine: NEGATIVE
Oxycodone Screen, Ur: NEGATIVE
Phencyclidine, Urine: NEGATIVE
Propoxyphene, Urine: NEGATIVE
Tricyclic Antidepressants, Urine: NEGATIVE

## 2017-07-24 NOTE — Progress Notes (Incomplete)
New OB Visit    Date of service: 07/24/2017    Debbie Brown  Is a 37 y.o. {Desc; marital status:62} female presenting for a New OB visit with Nurse. Name of Father of [name] is *** and {IS/IS NOT:21386} involved. Pt {DOES:19736} work at [employer]    Pt {IS/IS ZOX:09604} Fertility pt. And was assisted by ***.    PT's PCP is: No primary care provider on file.     DOB: 1980/04/27                                             Subjective:       Patient's last menstrual period was 12/18/2016.   OB History   Gravida Para Term Preterm AB Living   SAB TAB Ectopic Molar Multiple Live Births   1         1      # Outcome Date GA Lbr Len/2nd Weight Sex Delivery Anes PTL Lv   3 Current            2 Term 02/11/14 [redacted]w[redacted]d  6 lb 7 oz (2.92 kg) M  EPI N LIV   1 SAB 2014 [redacted]w[redacted]d                    Social History     Tobacco Use   Smoking Status Never Smoker   Smokeless Tobacco Never Used        Social History     Substance and Sexual Activity   Alcohol Use Never   ??? Frequency: Never        Allergies: Patient has no known allergies.      Current Outpatient Medications:   ???  Prenatal Vit-Fe Fumarate-FA (PRENATAL VITAMIN) 27-0.8 MG TABS, Take 1 tablet by mouth daily, Disp: , Rfl:       Vital Signs Height  (1.727 m), weight 133 lb 6.4 oz (60.5 kg), last menstrual period 12/18/2016.      No results found for this visit on 07/24/17.      Pain: {Pain assessment:31927}                            Nausea: {yes/no:310449}      Vomiting: {yes/no:310449}        Breast enlargement or tenderness: {yes/no:310449}    Frequency of urination:{yes/no:310449}      Fatigue: {yes/no:310449}         Patient history reviewed. Educational materials given and genetic testing reviewed.      Breastfeeding handouts given and reviewed: {yes no:315493::"Yes"}     If BMI over 35 was HgA1C drawn: {YES/NO/NA:19705}      If history of thyroid problem was TSH drawn: {YES/NO/NA:19705}       My chart set up and activated:{yes no:315493::"Yes"}     Assessment:     {No  diagnosis found. (Refresh or delete this SmartLink)}    Plan: Order Routine Prenatal Lab {yes no:315493::"Yes"}     Order additional testing if requested         Order Prenatal Vitamins   {yes no:315493::"Yes"}             Appointment with {Blank single:19197::"Kathleen Pool CNM","Susan Smith CNM","Dr. Hedges","Dr. Doty-Armstrong"} in {Blank single:19197::"1 week","2 weeks","3 weeks","4 weeks"} for Dating U/S and  total body exam if indicated      Nurse:   ***

## 2017-07-24 NOTE — Progress Notes (Signed)
New OB Visit    Date of service: 07/24/2017    Debbie Brown  Is a 37 y.o. married female presenting for a New OB visit with Nurse. Name of Father of Debbie Brown is Debbie Brown and is involved. Pt does work.        PT's PCP is: No primary care provider on file.     DOB: 1980/04/23                                             Subjective:       Patient's last menstrual period was 12/18/2016.   OB History   Gravida Para Term Preterm AB Living   SAB TAB Ectopic Molar Multiple Live Births   1         1      # Outcome Date GA Lbr Len/2nd Weight Sex Delivery Anes PTL Lv   3 Current            2 Term 02/11/14 [redacted]w[redacted]d  6 lb 7 oz (2.92 kg) M  EPI N LIV   1 SAB 2014 [redacted]w[redacted]d                    Social History     Tobacco Use   Smoking Status Never Smoker   Smokeless Tobacco Never Used        Social History     Substance and Sexual Activity   Alcohol Use Never   ??? Frequency: Never        Allergies: Patient has no known allergies.      Current Outpatient Medications:   ???  Prenatal Vit-Fe Fumarate-FA (PRENATAL VITAMIN) 27-0.8 MG TABS, Take 1 tablet by mouth daily, Disp: , Rfl:       Vital Signs Blood pressure 100/62, pulse 62, height  (1.727 m), weight 133 lb 6.4 oz (60.5 kg), last menstrual period 12/18/2016.      No results found for this visit on 07/24/17.      Pain: none                            Nausea: no      Vomiting: no        Breast enlargement or tenderness: no    Frequency of urination:yes      Fatigue: yes         Patient history reviewed. Educational materials given and genetic testing reviewed.      Breastfeeding handouts given and reviewed: Yes     If BMI over 35 was HgA1C drawn: no      If history of thyroid problem was TSH drawn: no       My chart set up and activated:Yes     Assessment:      Diagnosis Orders   1. Amenorrhea  C.trachomatis N.gonorrhoeae DNA, Urine    Urine Culture    Urine Drug Screen, Comprehensive   2. Supervision of other normal pregnancy, antepartum  C.trachomatis N.gonorrhoeae DNA, Urine     Urine Culture    Urine Drug Screen, Comprehensive       Plan: Order Routine Prenatal Lab No: already done at [facility]      Order additional testing if requested         Order Prenatal  Vitamins   No: OTC              Appointment with Debbie Brown CNM in 1 week for Dating U/S and total body exam if indicated      Nurse:   Debbie Brown RMA

## 2017-07-25 LAB — CULTURE, URINE: Culture: NO GROWTH

## 2017-07-25 LAB — C.TRACHOMATIS N.GONORRHOEAE DNA, URINE
C. trachomatis DNA ,Urine: NEGATIVE
N. gonorrhoeae DNA, Urine: NEGATIVE

## 2017-07-26 ENCOUNTER — Ambulatory Visit: Admit: 2017-07-26 | Discharge: 2017-07-26 | Payer: PRIVATE HEALTH INSURANCE

## 2017-07-26 DIAGNOSIS — O26843 Uterine size-date discrepancy, third trimester: Secondary | ICD-10-CM

## 2017-07-26 LAB — US OB FOLLOW UP TRANSABDOMINAL APPROACH
Abdominal Circumference: 24.8 cm
Biparietal Diameter: 8 cm
Estimated Fetal Weight: 1452 grams
HC/AC: 1.15
Head Circumference: 28.6 cm

## 2017-07-26 NOTE — Progress Notes (Signed)
Debbie Brown is here at [redacted]w[redacted]d for:     Chief Complaint   Patient presents with   . Routine Prenatal Visit     Follow up in house ultrasound. Patient is transfer from [place], [place]. H/O aging placenta with previous pregnancy.        Estimated Due Date: Estimated Date of Delivery: 09/24/17    OB History   Gravida Para Term Preterm AB Living   3 1 1   1 1    SAB TAB Ectopic Molar Multiple Live Births   1         1      # Outcome Date GA Lbr Len/2nd Weight Sex Delivery Anes PTL Lv   3 Current            2 Term 02/11/14 [redacted]w[redacted]d  6 lb 7 oz (2.92 kg) M  EPI N LIV   1 SAB 2014 [redacted]w[redacted]d               No past medical history on file.    Past Surgical History:   Procedure Laterality Date   . APPENDECTOMY  2000       Social History     Tobacco Use   Smoking Status Never Smoker   Smokeless Tobacco Never Used        Social History     Substance and Sexual Activity   Alcohol Use Never   . Frequency: Never       Results for orders placed or performed in visit on 07/26/17   US OB Follow Up Transabdominal Approach   Result Value Ref Range    Biparietal Diameter 8 cm    Abdominal Circumference 24.8 cm    Femoral Diameter  cm    Head Circumference 28.6 cm    HC/AC 1.15     Estimated Fetal Weight 1,452 grams       Vitals:  BP 108/68   Wt 136 lb (61.7 kg)   LMP 12/18/2016   BMI 20.68 kg/m   Estimated body mass index is 20.68 kg/m as calculated from the following:    Height as of 07/24/17: 5\' 8"  (1.727 m).    Weight as of this encounter: 136 lb (61.7 kg).      HPI: runs 4-5 miles and lifts weights     PT denies fever, chills, nausea and vomiting       Abdomen: enlarged, gravid, soft, nontender     Results reviewed today:    Results for orders placed or performed in visit on 07/26/17   US OB Follow Up Transabdominal Approach   Result Value Ref Range    Biparietal Diameter 8 cm    Abdominal Circumference 24.8 cm    Femoral Diameter  cm    Head Circumference 28.6 cm    HC/AC 1.15     Estimated Fetal Weight 1,452 grams       See prenatal  vital sign section and fetal assessment section    ASSESSMENT & Plan    Diagnosis Orders   1. Uterine size-date discrepancy in third trimester  US OB Follow Up Transabdominal Approach   2. [redacted] weeks gestation of pregnancy  US OB Follow Up Transabdominal Approach   3. Encounter for supervision of other normal pregnancy in third trimester     4. Elderly multigravida in third trimester       Discussed 23 % growth with some low growth parameters, patients first child 6-5 and 39 weeks.  Patient already falls  in to risk category with hx of early twin demise; fertility this pregnancy (letrozole); and AMA  Will place on fetal surveillance with weekly bpp and growth Q 3-4 weeks, add NST after 36 weeks  Patient verbalizes understanding and reviewed fetal movements        I am having Debbie Brown maintain her Prenatal Vitamin.    Return in about 1 week (around 08/02/2017) for weekly bpp to start next week and every 3-4 week add a growth sono.    There are no Patient Instructions on file for this visit.          Venia Minks Amando Ishikawa,07/26/2017 4:08 PM

## 2017-07-26 NOTE — Progress Notes (Signed)
Unable to obtain urine.

## 2017-08-02 ENCOUNTER — Encounter

## 2017-08-02 ENCOUNTER — Ambulatory Visit: Admit: 2017-08-02 | Discharge: 2017-08-02 | Payer: PRIVATE HEALTH INSURANCE

## 2017-08-02 DIAGNOSIS — O09523 Supervision of elderly multigravida, third trimester: Secondary | ICD-10-CM

## 2017-08-09 ENCOUNTER — Ambulatory Visit: Admit: 2017-08-09 | Discharge: 2017-08-09 | Payer: PRIVATE HEALTH INSURANCE

## 2017-08-09 NOTE — Progress Notes (Signed)
Debbie Brown is here at [redacted]w[redacted]d for:    Chief Complaint   Patient presents with   ??? Routine Prenatal Visit     Follow up in house ultrasound./BPP.        Estimated Due Date: Estimated Date of Delivery: 09/24/17    OB History   Gravida Para Term Preterm AB Living   SAB TAB Ectopic Molar Multiple Live Births   1         1      # Outcome Date GA Lbr Len/2nd Weight Sex Delivery Anes PTL Lv   3 Current            2 Term 02/11/14 [redacted]w[redacted]d  6 lb 7 oz (2.92 kg) M  EPI N LIV   1 SAB 2014 [redacted]w[redacted]d               No past medical history on file.    Past Surgical History:   Procedure Laterality Date   ??? APPENDECTOMY  2000       Social History     Tobacco Use   Smoking Status Never Smoker   Smokeless Tobacco Never Used        Social History     Substance and Sexual Activity   Alcohol Use Never   ??? Frequency: Never       No results found for this visit on 08/09/17.    Vitals:  BP 112/70    Wt 140 lb 12.8 oz (63.9 kg)    LMP 12/18/2016    BMI 21.41 kg/m??   Estimated body mass index is 21.41 kg/m?? as calculated from the following:    Height as of 07/24/17:  (1.727 m).    Weight as of this encounter: 140 lb 12.8 oz (63.9 kg).      HPI: here for routine ob visit, denies c/o, bpp 8/8     PT denies fever, chills, nausea and vomiting       Abdomen: enlarged, gravid, soft, nontender     Results reviewed today:    No results found for this visit on 08/09/17.    See prenatal vital sign section and fetal assessment section    ASSESSMENT & Plan    Diagnosis Orders   1. SGA (small for gestational age)  US Fetal Biophysical Profile WO Non Stress Testing   2. Elderly multigravida in third trimester  US Fetal Biophysical Profile WO Non Stress Testing   3. [redacted] weeks gestation of pregnancy  US Fetal Biophysical Profile WO Non Stress Testing             I am having Debbie Brown maintain her Prenatal Vitamin.    Return for weekly bpps, growth at 34 and 38, every other week appts until 36 wks.    There are no Patient Instructions on file for  this visit.          Debbie Brown,08/09/2017 3:06 PM

## 2017-08-10 ENCOUNTER — Encounter

## 2017-08-16 ENCOUNTER — Encounter

## 2017-08-16 NOTE — Telephone Encounter (Signed)
Patient called in on Monday 08/14/17 to cancel her bpp on 08/24/2017 and 08/31/17 due to insurance issues. She did not want to keep her appoint with just Darl Pikes either. She does have the rest of June scheduled for her BPPs.

## 2017-08-16 NOTE — Telephone Encounter (Signed)
Should we consider this a transfer out of our practice?

## 2017-08-24 ENCOUNTER — Encounter

## 2017-08-31 ENCOUNTER — Ambulatory Visit: Admit: 2017-08-31 | Discharge: 2017-08-31 | Payer: PRIVATE HEALTH INSURANCE

## 2017-08-31 ENCOUNTER — Encounter

## 2017-08-31 ENCOUNTER — Inpatient Hospital Stay: Payer: PRIVATE HEALTH INSURANCE

## 2017-08-31 DIAGNOSIS — Z3A36 36 weeks gestation of pregnancy: Secondary | ICD-10-CM

## 2017-08-31 LAB — US OB FOLLOW UP TRANSABDOMINAL APPROACH
Abdominal Circumference: 29.7 cm
Biparietal Diameter: 8.6 cm
Estimated Fetal Weight: 2393 grams
HC/AC: 1.04
Head Circumference: 31 cm

## 2017-08-31 MED ORDER — VALACYCLOVIR HCL 1 G PO TABS
1 g | ORAL_TABLET | Freq: Every day | ORAL | 1 refills | Status: DC
Start: 2017-08-31 — End: 2017-09-07

## 2017-08-31 NOTE — Progress Notes (Signed)
Debbie Brown is here at [redacted]w[redacted]d for:    Chief Complaint   Patient presents with   ??? Routine Prenatal Visit     Follow up in [place] ultrasound. GBS today.        Estimated Due Date: Estimated Date of Delivery: 09/24/17    OB History   Gravida Para Term Preterm AB Living   3 1 1   1 1    SAB TAB Ectopic Molar Multiple Live Births   1         1      # Outcome Date GA Lbr Len/2nd Weight Sex Delivery Anes PTL Lv   3 Current            2 Term 02/11/14 [redacted]w[redacted]d  6 lb 7 oz (2.92 kg) M  EPI N LIV   1 SAB 2014 [redacted]w[redacted]d               No past medical history on file.    Past Surgical History:   Procedure Laterality Date   ??? APPENDECTOMY  2000       Social History     Tobacco Use   Smoking Status Never Smoker   Smokeless Tobacco Never Used        Social History     Substance and Sexual Activity   Alcohol Use Never   ??? Frequency: Never       Results for orders placed or performed in visit on 08/31/17   US OB Follow Up Transabdominal Approach   Result Value Ref Range    Biparietal Diameter 8.6 cm    Abdominal Circumference 29.7 cm    Femoral Diameter  cm    Head Circumference 31 cm    HC/AC 1.04     Estimated Fetal Weight 2,393 grams       Vitals:  BP 108/70    Wt 141 lb 9.6 oz (64.2 kg)    LMP 12/18/2016    BMI 21.53 kg/m??   Estimated body mass index is 21.53 kg/m?? as calculated from the following:    Height as of 07/24/17: 5\' 8"  (1.727 m).    Weight as of this encounter: 141 lb 9.6 oz (64.2 kg).      HPI: here for routine ob visit, being followed for IUGR, bpp 8/8     PT denies fever, chills, nausea and vomiting       Abdomen: enlarged, gravid, soft, nontender     Results reviewed today:    Results for orders placed or performed in visit on 08/31/17   US OB Follow Up Transabdominal Approach   Result Value Ref Range    Biparietal Diameter 8.6 cm    Abdominal Circumference 29.7 cm    Femoral Diameter  cm    Head Circumference 31 cm    HC/AC 1.04     Estimated Fetal Weight 2,393 grams       See prenatal vital sign section and fetal assessment  section    ASSESSMENT & Plan    Diagnosis Orders   1. SGA (small for gestational age)  US Fetal Biophysical Profile WO Non Stress Testing    US OB Follow Up Transabdominal Approach   2. Elderly multigravida in third trimester  US Fetal Biophysical Profile WO Non Stress Testing    US OB Follow Up Transabdominal Approach   3. [redacted] weeks gestation of pregnancy  US Fetal Biophysical Profile WO Non Stress Testing    US OB Follow Up Transabdominal Approach  Strep B Screen, Vaginal / Rectal   4. History of herpes simplex infection  valACYclovir (VALTREX) 1 g tablet             I am having Debbie Brown start on valACYclovir. I am also having her maintain her Prenatal Vitamin.    Return in about 1 week (around 09/07/2017) for ob sono for bpp add nst that day also.    There are no Patient Instructions on file for this visit.          Debbie Brown,09/03/2017 12:18 PM

## 2017-09-03 LAB — CULTURE, STREP B SCREEN, VAGINAL/RECTAL: Culture: NEGATIVE

## 2017-09-04 NOTE — Telephone Encounter (Signed)
Patient was given prescription forValtrex however will cost her around $300.00 Patient concerned about cost.

## 2017-09-04 NOTE — Telephone Encounter (Signed)
Acyclovir 400 BID #60 rf x1

## 2017-09-04 NOTE — Telephone Encounter (Signed)
Patient notified however wants to wait for prescription for 1 week d/t insurance issues. She will call when she wants prescription.

## 2017-09-07 MED ORDER — ACYCLOVIR 400 MG PO TABS
400 MG | ORAL_TABLET | Freq: Two times a day (BID) | ORAL | 1 refills | Status: DC
Start: 2017-09-07 — End: 2021-05-18

## 2017-09-07 NOTE — Progress Notes (Signed)
Debbie Brown is here at 7176w4d for:    Chief Complaint   Patient presents with   ??? Routine Prenatal Visit     in house NST & bpp today.       Estimated Due Date: Estimated Date of Delivery: 09/24/17    OB History   Gravida Para Term Preterm AB Living   3 1 1   1 1    SAB TAB Ectopic Molar Multiple Live Births   1         1      # Outcome Date GA Lbr Len/2nd Weight Sex Delivery Anes PTL Lv   3 Current            2 Term 02/11/14 7190w3d  6 lb 7 oz (2.92 kg) M  EPI N LIV   1 SAB 2014 8262w0d               History reviewed. No pertinent past medical history.    Past Surgical History:   Procedure Laterality Date   ??? APPENDECTOMY  2000       Social History     Tobacco Use   Smoking Status Never Smoker   Smokeless Tobacco Never Used        Social History     Substance and Sexual Activity   Alcohol Use Never   ??? Frequency: Never       No results found for this visit on 09/07/17.    Vitals:  BP 110/64    Wt 142 lb (64.4 kg)    LMP 12/18/2016    BMI 21.59 kg/m??   Estimated body mass index is 21.59 kg/m?? as calculated from the following:    Height as of 07/24/17: 5\' 8"  (1.727 m).    Weight as of this encounter: 142 lb (64.4 kg).      HPI: here for routine ob visit, didn't start valtrex due to cost, now has insurance card, bpp 8/8 afi 8cm     PT denies fever, chills, nausea and vomiting       Abdomen: enlarged, gravid, soft, nontender     Results reviewed today:    No results found for this visit on 09/07/17.    See prenatal vital sign section and fetal assessment section    ASSESSMENT & Plan    Diagnosis Orders   1. SGA (small for gestational age)  US Fetal Biophysical Profile WO Non Stress Testing   2. Elderly multigravida in third trimester  US Fetal Biophysical Profile WO Non Stress Testing   3. [redacted] weeks gestation of pregnancy  US Fetal Biophysical Profile WO Non Stress Testing   4. History of herpes simplex infection  acyclovir (ZOVIRAX) 400 MG tablet             I have discontinued Debbie Brown's valACYclovir. I am also having  her start on acyclovir. Additionally, I am having her maintain her Prenatal Vitamin.    Return in about 1 week (around 09/14/2017) for ob sono for efw and bpp.    There are no Patient Instructions on file for this visit.          Venia MinksSusan Michelle Warren Kugelman,09/07/2017 5:27 PM

## 2017-09-11 NOTE — Telephone Encounter (Signed)
Please let Debbie Brown know that at this point in pregnancy and for this short of exposure it is not as concerning.  If she has any respiratory issues she should report to us although that is more of a problem for her health than the baby.

## 2017-09-11 NOTE — Telephone Encounter (Signed)
Discussed patient concerns about being exposed to painting at her house. Advised patient of proper ventilation. Patient questions and concerns answered and patient voices understanding.

## 2017-09-11 NOTE — Telephone Encounter (Signed)
From: Irving Burton Innocent  To: Simonne Martinet, APRN - CNM  Sent: 09/09/2017 10:11 PM EDT  Subject: Non-Urgent Medical Question    I was painting a bathroom vanity this afternoon and realized it was an oil based paint... probably for 45 minutes and then again later for 15 minutes, I wasn't wearing gloves. I washed my skin with coconut oil and all the paint came off.  I'm a little worried about the fine intake now.   Any thoughts on this? How bad is this?  Irving Burton Christenson

## 2017-09-14 ENCOUNTER — Ambulatory Visit: Admit: 2017-09-14 | Discharge: 2017-09-14 | Payer: PRIVATE HEALTH INSURANCE

## 2017-09-14 LAB — US OB 1 OR MORE FETUS LIMITED
Abdominal Circumference: 29 cm
Biparietal Diameter: 8.4 cm
Estimated Fetal Weight: 2432 grams
HC/AC: 1.08
Head Circumference: 31.4 cm

## 2017-09-14 NOTE — Progress Notes (Signed)
Debbie Brown is here at [redacted]w[redacted]d for:    Chief Complaint   Patient presents with   . Routine Prenatal Visit     Follow up in house BPP.        Estimated Due Date: Estimated Date of Delivery: 09/24/17    OB History   Gravida Para Term Preterm AB Living   3 1 1   1 1    SAB TAB Ectopic Molar Multiple Live Births   1         1      # Outcome Date GA Lbr Len/2nd Weight Sex Delivery Anes PTL Lv   3 Current            2 Term 02/11/14 [redacted]w[redacted]d  6 lb 7 oz (2.92 kg) M  EPI N LIV   1 SAB 2014 [redacted]w[redacted]d               No past medical history on file.    Past Surgical History:   Procedure Laterality Date   . APPENDECTOMY  2000       Social History     Tobacco Use   Smoking Status Never Smoker   Smokeless Tobacco Never Used        Social History     Substance and Sexual Activity   Alcohol Use Never   . Frequency: Never       Results for orders placed or performed in visit on 09/14/17   US OB 1 or More Fetus Limited   Result Value Ref Range    Biparietal Diameter 8.4 cm    Abdominal Circumference 29 cm    Femoral Diameter  cm    Head Circumference 31.4 cm    HC/AC 1.08     Estimated Fetal Weight 2,432 grams       Vitals:  BP 112/62   Wt 142 lb 9.6 oz (64.7 kg)   LMP 12/18/2016   BMI 21.68 kg/m   Estimated body mass index is 21.68 kg/m as calculated from the following:    Height as of 07/24/17: 5\' 8"  (1.727 m).    Weight as of this encounter: 142 lb 9.6 oz (64.7 kg).      HPI: here for routine ob visit and fetal surveillance, pt is iugr and oligohydramnios, bpp 8/8 and normal cord dopplers     PT denies fever, chills, nausea and vomiting       Abdomen: enlarged, gravid, soft, nontender     Results reviewed today:    Results for orders placed or performed in visit on 09/14/17   US OB 1 or More Fetus Limited   Result Value Ref Range    Biparietal Diameter 8.4 cm    Abdominal Circumference 29 cm    Femoral Diameter  cm    Head Circumference 31.4 cm    HC/AC 1.08     Estimated Fetal Weight 2,432 grams       See prenatal vital sign section and  fetal assessment section    ASSESSMENT & Plan    Diagnosis Orders   1. SGA (small for gestational age)  US Fetal Biophysical Profile WO Non Stress Testing    US OB 1 or More Fetus Limited   2. Elderly multigravida in third trimester  US Fetal Biophysical Profile WO Non Stress Testing    US OB 1 or More Fetus Limited   3. [redacted] weeks gestation of pregnancy  US Fetal Biophysical Profile WO Non Stress Testing    US  OB 1 or More Fetus Limited       reviewed with patient and Dr. Salvadore Farber, will induce at 39 weeks for iugr and oligohydramnios; reinstructed on fetal kick counts and will recheck NST prior to induction on 6/23      I am having Debbie Brown maintain her Prenatal Vitamin and acyclovir.    No follow-ups on file.    There are no Patient Instructions on file for this visit.          Debbie Brown Debbie Brown,09/17/2017 2:05 AM

## 2017-09-15 ENCOUNTER — Ambulatory Visit: Payer: PRIVATE HEALTH INSURANCE

## 2017-09-15 NOTE — Other (Signed)
Message left for S.Smith CNM to phone unit regarding NST

## 2017-09-15 NOTE — Discharge Instructions (Signed)
OUTPATIENT DISCHARGE    Dr. Hedges, Kathy Pool CNM   [place] [phone] or [place] [phone]    Dr. Cordero  [phone]    Dr. Doty-Armstrong, Susan Smith CNM [phone]    Val Floro CNM  [phone]    Dr. Anne Clark  [phone]    Susan Hotelling CNM  [phone]       ACTIVITY LIMITATIONS:  ( X )Up and about as desired and tolerated  (  )Up to bathroom only  (  )Lay on either side  (  )Avoid heavy lifting or exercise  (  )No sex  (  )No nipple stimulation  (  )Complet bedrest  (  )Avoid using stairs  ( X )Increase fluids  **DRINK AT LEAST eight-8oz. Glasses of water daily.**     Call your Doctor if:  ( X )Contractions are every 5 minutes apart (from start of one to the start of the next contraction) lasting 60 seconds for at least 1 hour, strong enough you can not walk or talk through the contraction and regular.  ( X )Bag of water breaks  ( X )Vaginal bleeding  ( X )Unusual pain occurs  ( X )Decreased fetal movement  (  )Preterm labor:  If you have 4 contractions in an hour     Keep your scheduled follow up appointment.   Or call for a follow up on________________________________________________.    IN CASE OF EMERGENCY CONTACT LABOR AND DELIVERY [phone].

## 2017-09-15 NOTE — Other (Signed)
Patient presents to L&D today for Oligohydramnios and IUGR    IUP at 38.5 weeks     NST is reactive. Quality of tracing is satisfactory.

## 2017-09-15 NOTE — Other (Addendum)
Debbie Brown CNM notified of tachycardia at the beginning of NST but heart rate has maintained at 135 bpm and has been reactive. Informed of variable as well. Ok to discharge home.

## 2017-09-15 NOTE — Other (Signed)
Discharge instructions reviewed with patient and questions answered at this time

## 2017-09-15 NOTE — Progress Notes (Signed)
Pt arrives for scheduled NST. Oriented to room. Vitals obtained and NST explained to pt

## 2017-09-15 NOTE — Other (Signed)
Abdomen palpated and remains soft. Pt states " I have been having some braxton hicks contractions lately. I feel them in my groin".

## 2017-09-17 ENCOUNTER — Inpatient Hospital Stay
Admit: 2017-09-17 | Discharge: 2017-09-19 | Disposition: A | Discharge status: Home / Self Care | Payer: PRIVATE HEALTH INSURANCE | Source: Ambulatory Visit

## 2017-09-17 ENCOUNTER — Ambulatory Visit: Admit: 2017-09-17 | Payer: PRIVATE HEALTH INSURANCE

## 2017-09-17 DIAGNOSIS — O365930 Maternal care for other known or suspected poor fetal growth, third trimester, not applicable or unspecified: Secondary | ICD-10-CM

## 2017-09-17 LAB — CBC WITH AUTO DIFFERENTIAL
Absolute Eos #: 0.04 10*3/uL (ref 0.00–0.44)
Absolute Immature Granulocyte: <0.03 10*3/uL (ref 0.00–0.30)
Absolute Lymph #: 1.74 10*3/uL (ref 1.10–3.70)
Absolute Mono #: 0.65 10*3/uL (ref 0.10–1.20)
Basophils Absolute: 0.04 10*3/uL (ref 0.00–0.20)
Basophils: 1 % (ref 0–2)
Eosinophils %: 1 % (ref 1–4)
Hematocrit: 35.8 % — ABNORMAL LOW (ref 36.3–47.1)
Hemoglobin: 12 g/dL (ref 11.9–15.1)
Immature Granulocytes: 0 %
Lymphocytes: 26 % (ref 24–43)
MCH: 33.4 pg (ref 25.2–33.5)
MCHC: 33.5 g/dL (ref 28.4–34.8)
MCV: 99.7 fL (ref 82.6–102.9)
MPV: 11.2 fL (ref 8.1–13.5)
Monocytes: 10 % (ref 3–12)
NRBC Automated: 0 per 100 WBC
Platelets: 181 10*3/uL (ref 138–453)
RBC: 3.59 m/uL — ABNORMAL LOW (ref 3.95–5.11)
RDW: 11.9 % (ref 11.8–14.4)
Seg Neutrophils: 62 % (ref 36–65)
Segs Absolute: 4.19 10*3/uL (ref 1.50–8.10)
WBC: 6.7 10*3/uL (ref 3.5–11.3)

## 2017-09-17 LAB — URINE DRUG SCREEN
Amphetamine Screen, Ur: NEGATIVE
Barbiturate Screen, Ur: NEGATIVE
Benzodiazepine Screen, Urine: NEGATIVE
Buprenorphine Urine: NEGATIVE
Cannabinoid Scrn, Ur: NEGATIVE
Cocaine Metabolite, Urine: NEGATIVE
Methadone Screen, Urine: NEGATIVE
Methamphetamine, Urine: NEGATIVE
Opiates, Urine: NEGATIVE
Oxycodone Screen, Ur: NEGATIVE
Phencyclidine, Urine: NEGATIVE
Propoxyphene, Urine: NEGATIVE
Tricyclic Antidepressants, Urine: NEGATIVE

## 2017-09-17 MED ORDER — ROPIVACAINE HCL 2 MG/ML IJ SOLN
INTRAMUSCULAR | Status: DC | PRN
Start: 2017-09-17 — End: 2017-09-17
  Administered 2017-09-17: 16:00:00 12 via EPIDURAL

## 2017-09-17 MED ORDER — METHYLERGONOVINE MALEATE 0.2 MG/ML IJ SOLN
0.2 MG/ML | INTRAMUSCULAR | Status: DC | PRN
Start: 2017-09-17 — End: 2017-09-19

## 2017-09-17 MED ORDER — LIDOCAINE HCL (PF) 1 % IJ SOLN
1 % | INTRAMUSCULAR | Status: DC | PRN
Start: 2017-09-17 — End: 2017-09-19

## 2017-09-17 MED ORDER — LACTATED RINGERS IV SOLN
INTRAVENOUS | Status: DC
Start: 2017-09-17 — End: 2017-09-19
  Administered 2017-09-17 (×2): via INTRAVENOUS

## 2017-09-17 MED ORDER — ROPIVACAINE HCL 2 MG/ML IJ SOLN
INTRAMUSCULAR | Status: DC | PRN
Start: 2017-09-17 — End: 2017-09-17
  Administered 2017-09-17: 16:00:00 5 via EPIDURAL

## 2017-09-17 MED ORDER — ACETAMINOPHEN 325 MG PO TABS
325 MG | ORAL | Status: DC | PRN
Start: 2017-09-17 — End: 2017-09-19

## 2017-09-17 MED ORDER — OXYTOCIN 30 UNITS IN 500 ML INFUSION
30 UNIT/500ML | INTRAVENOUS | Status: DC | PRN
Start: 2017-09-17 — End: 2017-09-19

## 2017-09-17 MED ORDER — LIDOCAINE HCL (PF) 2 % IJ SOLN
2 % | INTRAMUSCULAR | Status: DC | PRN
Start: 2017-09-17 — End: 2017-09-17
  Administered 2017-09-17: 16:00:00 2 via INTRAVENOUS
  Administered 2017-09-17: 16:00:00 3 via INTRAVENOUS

## 2017-09-17 MED ORDER — EPHEDRINE SULFATE 50 MG/ML IJ SOLN
50 MG/ML | INTRAMUSCULAR | Status: DC | PRN
Start: 2017-09-17 — End: 2017-09-19

## 2017-09-17 MED ORDER — OXYTOCIN 30 UNITS IN 500 ML INFUSION
30 UNIT/500ML | INTRAVENOUS | Status: DC
Start: 2017-09-17 — End: 2017-09-19
  Administered 2017-09-17: 22:00:00 12 m[IU]/min via INTRAVENOUS
  Administered 2017-09-17: 12:00:00 1 m[IU]/min via INTRAVENOUS

## 2017-09-17 MED ORDER — ROPIVACAINE HCL 2 MG/ML IJ SOLN
INTRAMUSCULAR | Status: DC
Start: 2017-09-17 — End: 2017-09-19

## 2017-09-17 MED ORDER — MISOPROSTOL 100 MCG PO TABS
100 MCG | ORAL | Status: DC | PRN
Start: 2017-09-17 — End: 2017-09-19

## 2017-09-17 MED ORDER — NORMAL SALINE FLUSH 0.9 % IV SOLN
0.9 % | INTRAVENOUS | Status: DC | PRN
Start: 2017-09-17 — End: 2017-09-19

## 2017-09-17 MED ORDER — WITCH HAZEL-GLYCERIN EX PADS
CUTANEOUS | Status: DC | PRN
Start: 2017-09-17 — End: 2017-09-19

## 2017-09-17 MED ORDER — NORMAL SALINE FLUSH 0.9 % IV SOLN
0.9 % | Freq: Two times a day (BID) | INTRAVENOUS | Status: DC
Start: 2017-09-17 — End: 2017-09-19

## 2017-09-17 MED ORDER — BENZOCAINE-MENTHOL 20-0.5 % EX AERO
CUTANEOUS | Status: DC | PRN
Start: 2017-09-17 — End: 2017-09-19

## 2017-09-17 MED ORDER — NALBUPHINE HCL 10 MG/ML IJ SOLN
10 MG/ML | INTRAMUSCULAR | Status: DC | PRN
Start: 2017-09-17 — End: 2017-09-19

## 2017-09-17 MED ORDER — ONDANSETRON HCL 4 MG/2ML IJ SOLN
4 MG/2ML | Freq: Four times a day (QID) | INTRAMUSCULAR | Status: DC | PRN
Start: 2017-09-17 — End: 2017-09-19

## 2017-09-17 MED ORDER — CARBOPROST TROMETHAMINE 250 MCG/ML IM SOLN
250 MCG/ML | INTRAMUSCULAR | Status: DC | PRN
Start: 2017-09-17 — End: 2017-09-19

## 2017-09-17 MED ORDER — NITROUS OXIDE 50% INHALATION
RESPIRATORY_TRACT | Status: DC | PRN
Start: 2017-09-17 — End: 2017-09-19

## 2017-09-17 MED FILL — OXYTOCIN-SODIUM CHLORIDE 30-0.9 UT/500ML-% IV SOLN: 30-0.9 UT/500ML-% | INTRAVENOUS | Qty: 500

## 2017-09-17 NOTE — Plan of Care (Signed)
Problem: Anxiety:  Goal: Level of anxiety will decrease  Description  Level of anxiety will decrease  Outcome: Ongoing     Problem: Breathing Pattern - Ineffective:  Goal: Able to breathe comfortably  Description  Able to breathe comfortably  Outcome: Ongoing     Problem: Fluid Volume - Imbalance:  Goal: Absence of imbalanced fluid volume signs and symptoms  Description  Absence of imbalanced fluid volume signs and symptoms  Outcome: Ongoing  Goal: Absence of intrapartum hemorrhage signs and symptoms  Description  Absence of intrapartum hemorrhage signs and symptoms  Outcome: Ongoing     Problem: Infection - Intrapartum Infection:  Goal: Will show no infection signs and symptoms  Description  Will show no infection signs and symptoms  Outcome: Ongoing     Problem: Labor Process - Prolonged:  Goal: Labor progression, first stage, within specified pattern  Description  Labor progression, first stage, within specified pattern  Outcome: Ongoing  Goal: Uterine contractions within specified parameters  Description  Uterine contractions within specified parameters  Outcome: Ongoing     Problem: Antenatal Screening:  Goal: Ability to make informed decisions regarding treatment has improved  Description  Ability to make informed decisions regarding treatment has improved  Outcome: Ongoing     Problem: Pain - Acute:  Goal: Pain level will decrease  Description  Pain level will decrease  Outcome: Ongoing  Goal: Able to cope with pain  Description  Able to cope with pain  Outcome: Ongoing     Problem: Tissue Perfusion - Uteroplacental, Altered:  Goal: Absence of abnormal fetal heart rate pattern  Description  Absence of abnormal fetal heart rate pattern  Outcome: Ongoing     Problem: Urinary Retention:  Goal: Experiences of bladder distention will decrease  Description  Experiences of bladder distention will decrease  Outcome: Ongoing  Goal: Urinary elimination within specified parameters  Description  Urinary elimination  within specified parameters  Outcome: Ongoing

## 2017-09-17 NOTE — Other (Signed)
Writer entered room to bring infant to nursery per mothers request, patient stated she got up to restroom with husband and felt better this time. Writer advised we recommend assistance from staff for first few ambulation attempts.

## 2017-09-17 NOTE — Progress Notes (Signed)
Michaelle Copas, CNM notified of SVE. RN ordered to stop pitocin and restart in a half hour at 12mu/hr

## 2017-09-17 NOTE — Progress Notes (Signed)
Pt up to restroom at this time

## 2017-09-17 NOTE — Anesthesia Pre-Procedure Evaluation (Signed)
Department of Anesthesiology  Preprocedure Note       Name:  Debbie Brown   Age:  37 y.o.  DOB:  01/27/1981                                          MRN:  [id number]         Date:  09/17/2017      Surgeon: * No surgeons listed *    Procedure: Labor Analgesia    Medications prior to admission:   Prior to Admission medications    Medication Sig Start Date End Date Taking? Authorizing Provider   acyclovir (ZOVIRAX) 400 MG tablet Take 1 tablet by mouth 2 times daily 09/07/17   Simonne Martinet, APRN - CNM   Prenatal Vit-Fe Fumarate-FA (PRENATAL VITAMIN) 27-0.8 MG TABS Take 1 tablet by mouth daily    Historical Provider, MD       Current medications:    Current Facility-Administered Medications   Medication Dose Route Frequency Provider Last Rate Last Dose   ??? acetaminophen (TYLENOL) tablet 650 mg  650 mg Oral Q4H PRN Simonne Martinet, APRN - CNM       ??? lactated ringers infusion   Intravenous Continuous Simonne Martinet, APRN - CNM 150 mL/hr at 09/17/17 1117     ??? nalbuphine (NUBAIN) injection 10 mg  10 mg Intravenous Q2H PRN Simonne Martinet, APRN - CNM       ??? nitrous oxide 50% inhalation 1 each  1 each Inhalation Continuous PRN Simonne Martinet, APRN - CNM       ??? ondansetron [facility]) injection 4 mg  4 mg Intravenous Q6H PRN Simonne Martinet, APRN - CNM       ??? oxytocin (PITOCIN) 30 units in 500 mL infusion  1 milli-units/min Intravenous Continuous Simonne Martinet, APRN - CNM 13 mL/hr at 09/17/17 1045 13 milli-units/min at 09/17/17 1045   ??? sodium chloride flush 0.9 % injection 10 mL  10 mL Intravenous 2 times per day Simonne Martinet, APRN - CNM       ??? sodium chloride flush 0.9 % injection 10 mL  10 mL Intravenous PRN Simonne Martinet, APRN - CNM       ??? benzocaine-menthol (DERMOPLAST) 20-0.5 % spray   Topical PRN Simonne Martinet, APRN - CNM       ??? carboprost (HEMABATE) injection 250 mcg  250 mcg Intramuscular PRN Simonne Martinet, APRN - CNM       ??? methylergonovine  (METHERGINE) injection 200 mcg  200 mcg Intramuscular PRN Simonne Martinet, APRN - CNM       ??? misoprostol (CYTOTEC) tablet 900 mcg  900 mcg Rectal PRN Simonne Martinet, APRN - CNM       ??? oxytocin (PITOCIN) 30 units in 500 mL infusion  1 milli-units/min Intravenous Continuous PRN Simonne Martinet, APRN - CNM       ??? witch hazel-glycerin (TUCKS) pad   Topical PRN Simonne Martinet, APRN - CNM       ??? lidocaine PF 1 % injection 30 mL  30 mL Other PRN Simonne Martinet, APRN - CNM           Allergies:  No Known Allergies    Problem List:    Patient Active Problem List   Diagnosis Code   ??? Elderly multigravida in third  trimester O09.523       Past Medical History:  History reviewed. No pertinent past medical history.    Past Surgical History:        Procedure Laterality Date   ??? APPENDECTOMY  2000       Social History:    Social History     Tobacco Use   ??? Smoking status: Never Smoker   ??? Smokeless tobacco: Never Used   Substance Use Topics   ??? Alcohol use: Never     Frequency: Never                                Counseling given: Not Answered      Vital Signs (Current):   Vitals:    09/17/17 1000 09/17/17 1015 09/17/17 1030 09/17/17 1045   BP: 118/78 114/67 (!) 107/51 108/74   Pulse: 56 68 113 57   Resp: 18 16 16 16    Temp: 36.7 ??C (98.1 ??F)      TempSrc: Oral      Weight:       Height:                                                  BP Readings from Last 3 Encounters:   09/17/17 108/74   09/15/17 107/75   09/14/17 112/62       NPO Status:                                                                                 BMI:   Wt Readings from Last 3 Encounters:   09/17/17 142 lb (64.4 kg)   09/15/17 142 lb (64.4 kg)   09/14/17 142 lb 9.6 oz (64.7 kg)     Body mass index is 21.59 kg/m??.    CBC:   Lab Results   Component Value Date    WBC 6.7 09/17/2017    RBC 3.59 09/17/2017    HGB 12.0 09/17/2017    HCT 35.8 09/17/2017    MCV 99.7 09/17/2017    RDW 11.9 09/17/2017    PLT 181 09/17/2017       CMP: No  results found for: NA, K, CL, CO2, BUN, CREATININE, GFRAA, AGRATIO, LABGLOM, GLUCOSE, PROT, CALCIUM, BILITOT, ALKPHOS, AST, ALT    POC Tests: No results for input(s): POCGLU, POCNA, POCK, POCCL, POCBUN, POCHEMO, POCHCT in the last 72 hours.    Coags: No results found for: PROTIME, INR, APTT    HCG (If Applicable): No results found for: PREGTESTUR, PREGSERUM, HCG, HCGQUANT     ABGs: No results found for: PHART, PO2ART, PCO2ART, HCO3ART, BEART, O2SATART     Type & Screen (If Applicable):  No results found for: LABABO, LABRH    Anesthesia Evaluation   no history of anesthetic complications:   Airway: Mallampati: II  TM distance: >3 FB   Neck ROM: full  Mouth opening: > = 3 FB Dental: normal exam         Pulmonary:Negative  Pulmonary ROS breath sounds clear to auscultation                             Cardiovascular:Negative CV ROS                      Neuro/Psych:   Negative Neuro/Psych ROS              GI/Hepatic/Renal:             Endo/Other: Negative Endo/Other ROS                    Abdominal:           Vascular: negative vascular ROS.                                       Anesthesia Plan      epidural     ASA 2             Anesthetic plan and risks discussed with patient.                      Truddie Crumble Maanav Kassabian, APRN - CRNA   09/17/2017

## 2017-09-17 NOTE — Progress Notes (Signed)
Up to the bathroom

## 2017-09-17 NOTE — Other (Signed)
Pt.arrived to unit ambulatory for scheduled induction 3934w0d. In restroom at this time.

## 2017-09-17 NOTE — Progress Notes (Signed)
Michaelle Copas, CNM updated on pt status.

## 2017-09-17 NOTE — Anesthesia Procedure Notes (Signed)
Epidural Block    Patient location during procedure: OB  Start time: 09/17/2017 11:43 AM  End time: 09/17/2017 11:47 AM  Reason for block: labor epidural  Staffing  Resident/CRNA: Exie Parody Larayah Clute, APRN - CRNA  Preanesthetic Checklist  Completed: patient identified, surgical consent, pre-op evaluation, timeout performed, IV checked, risks and benefits discussed, monitors and equipment checked, anesthesia consent given, oxygen available and patient being monitored  Epidural  Patient position: sitting  Prep: ChloraPrep  Patient monitoring: frequent blood pressure checks and continuous pulse ox  Approach: midline  Location: lumbar (1-5) (L3-4)  Injection technique: LOR air and LOR saline  Provider prep: mask and sterile gloves  Needle  Needle type: Tuohy   Needle gauge: 17 G  Needle length: 3.5 in  Needle insertion depth: 5 cm  Catheter type: side hole  Catheter size: 19 G  Catheter at skin depth: 11 cm  Test dose: negative  Kit: braun  Lot number: [id number]  Expiration date: 06/26/2018  Assessment  Hemodynamics: stable  Attempts: 1

## 2017-09-17 NOTE — Progress Notes (Signed)
Debbie Brown, CNM in patient's room at this time

## 2017-09-17 NOTE — Progress Notes (Signed)
Pt up to bathroom at this time.

## 2017-09-17 NOTE — Progress Notes (Signed)
Pt up on birthing ball at this time.

## 2017-09-17 NOTE — Progress Notes (Signed)
Michaelle Copas, CNM notified of FHR decreased to the 80's.

## 2017-09-17 NOTE — Other (Signed)
Pt. Ambulated to restroom with 2 assist, after voiding and sitting for a few min patient became weak and light headed, returned to bed via w/c and states she feels much better. Will continue to monitor. Call light within reach

## 2017-09-17 NOTE — Progress Notes (Signed)
S. Smith,cnm notified to attend delivery.

## 2017-09-17 NOTE — Progress Notes (Signed)
Michaelle Copas, CNM notified of SVE and pt's pain level. RN ordered to allow pt to have epidural when she is ready.

## 2017-09-17 NOTE — Progress Notes (Addendum)
RN given orders to stop LR bolus and oxygen. LR started at 150 ml/ hr

## 2017-09-17 NOTE — Progress Notes (Signed)
A. Dorkoskie, CRNA in pt's room for epidural

## 2017-09-18 MED ORDER — NORMAL SALINE FLUSH 0.9 % IV SOLN
0.9 % | Freq: Two times a day (BID) | INTRAVENOUS | Status: DC
Start: 2017-09-18 — End: 2017-09-19

## 2017-09-18 MED ORDER — NORMAL SALINE FLUSH 0.9 % IV SOLN
0.9 % | INTRAVENOUS | Status: DC | PRN
Start: 2017-09-18 — End: 2017-09-19

## 2017-09-18 MED ORDER — ACETAMINOPHEN 325 MG PO TABS
325 MG | ORAL | Status: DC | PRN
Start: 2017-09-18 — End: 2017-09-19
  Administered 2017-09-18 – 2017-09-19 (×2): 650 mg via ORAL

## 2017-09-18 MED ORDER — IBUPROFEN 800 MG PO TABS
800 MG | Freq: Three times a day (TID) | ORAL | Status: DC
Start: 2017-09-18 — End: 2017-09-19
  Administered 2017-09-18 – 2017-09-19 (×4): 800 mg via ORAL

## 2017-09-18 MED ORDER — LANOLIN EX OINT
CUTANEOUS | Status: DC | PRN
Start: 2017-09-18 — End: 2017-09-19

## 2017-09-18 MED ORDER — DOCUSATE SODIUM 100 MG PO CAPS
100 MG | Freq: Two times a day (BID) | ORAL | Status: DC | PRN
Start: 2017-09-18 — End: 2017-09-19

## 2017-09-18 MED ORDER — BENZOCAINE-MENTHOL 20-0.5 % EX AERO
Freq: Two times a day (BID) | CUTANEOUS | Status: DC
Start: 2017-09-18 — End: 2017-09-19

## 2017-09-18 MED ORDER — METHYLERGONOVINE MALEATE 0.2 MG/ML IJ SOLN
0.2 MG/ML | Freq: Once | INTRAMUSCULAR | Status: AC | PRN
Start: 2017-09-18 — End: 2017-09-17

## 2017-09-18 MED ORDER — MEASLES, MUMPS & RUBELLA VAC SC INJ
SUBCUTANEOUS | Status: AC
Start: 2017-09-18 — End: 2017-09-19
  Administered 2017-09-19: 12:00:00 0.5 mL via SUBCUTANEOUS

## 2017-09-18 MED ORDER — OXYTOCIN 30 UNITS IN 500 ML INFUSION
30 UNIT/500ML | INTRAVENOUS | Status: DC
Start: 2017-09-18 — End: 2017-09-19

## 2017-09-18 MED ORDER — TETANUS-DIPHTH-ACELL PERTUSSIS 5-2.5-18.5 LF-MCG/0.5 IM SUSP
INTRAMUSCULAR | Status: AC
Start: 2017-09-18 — End: 2017-09-19
  Administered 2017-09-19: 12:00:00 0.5 mL via INTRAMUSCULAR

## 2017-09-18 MED ORDER — WITCH HAZEL-GLYCERIN EX PADS
Freq: Two times a day (BID) | CUTANEOUS | Status: DC
Start: 2017-09-18 — End: 2017-09-19

## 2017-09-18 MED ORDER — OXYCODONE-ACETAMINOPHEN 5-325 MG PO TABS
5-325 MG | Freq: Four times a day (QID) | ORAL | Status: DC | PRN
Start: 2017-09-18 — End: 2017-09-19
  Administered 2017-09-18 (×2): 1 via ORAL

## 2017-09-18 MED FILL — DERMOPLAST 20-0.5 % EX AERO: 20-0.5 % | CUTANEOUS | Qty: 56

## 2017-09-18 MED FILL — OXYCODONE-ACETAMINOPHEN 5-325 MG PO TABS: 5-325 mg | ORAL | Qty: 1

## 2017-09-18 MED FILL — TYLENOL 325 MG PO TABS: 325 mg | ORAL | Qty: 2

## 2017-09-18 MED FILL — IBUPROFEN 800 MG PO TABS: 800 mg | ORAL | Qty: 1

## 2017-09-18 NOTE — Progress Notes (Signed)
Social work consulted cancel because patient change her mind and had the lab work done.    Margarita Mailarmen S Zacory Fiola, LSW

## 2017-09-18 NOTE — Progress Notes (Signed)
Department of Obstetrics and Gynecology  Labor and Delivery       Post Partum Progress Note             SUBJECTIVE:  1st day postpartum, denies c/o    OBJECTIVE:      Vitals:  BP 108/75    Pulse 64    Temp 98.2 ??F (36.8 ??C) (Oral)    Resp 14    Ht 5\' 8"  (1.727 m)    Wt 142 lb (64.4 kg)    LMP 12/18/2016    SpO2 94%    Breastfeeding? Unknown    BMI 21.59 kg/m??   Patient Vitals for the past 24 hrs:   BP Temp Temp src Pulse Resp SpO2   09/18/17 1130 -- -- -- 64 -- --   09/18/17 0708 108/75 98.2 ??F (36.8 ??C) Oral 50 14 --   09/18/17 0300 119/78 97.9 ??F (36.6 ??C) Oral (!) 45 16 --   09/17/17 2342 (!) 104/59 98.3 ??F (36.8 ??C) Oral 51 14 --   09/17/17 2121 (!) 92/56 -- -- 63 -- --   09/17/17 2106 (!) 99/58 -- -- 67 -- --   09/17/17 2051 (!) 129/57 -- -- 80 -- --   09/17/17 2036 114/80 -- -- 56 -- --   09/17/17 2021 134/82 -- -- 56 -- --   09/17/17 2006 139/70 -- -- 56 -- --   09/17/17 1951 (!) 142/80 -- -- 53 -- --   09/17/17 1940 -- 98.2 ??F (36.8 ??C) Oral -- 16 --   09/17/17 1930 (!) 142/63 -- -- 55 16 --   09/17/17 1900 124/81 -- -- 57 16 --   09/17/17 1845 114/80 -- -- 58 16 --   09/17/17 1815 -- -- -- -- -- 94 %   09/17/17 1800 111/71 -- -- 51 16 98 %   09/17/17 1730 124/75 98.1 ??F (36.7 ??C) Oral 54 16 99 %   09/17/17 1700 125/79 -- -- 50 16 --   09/17/17 1630 124/85 -- -- 52 16 --   09/17/17 1601 (!) 143/87 98.4 ??F (36.9 ??C) Oral 50 16 --   09/17/17 1530 131/83 -- -- 51 16 --   09/17/17 1500 115/76 -- -- (!) 47 16 99 %   09/17/17 1445 -- -- -- -- -- 99 %   09/17/17 1430 133/81 -- -- (!) 48 16 99 %   09/17/17 1415 -- -- -- -- -- 99 %   09/17/17 1400 127/78 98.2 ??F (36.8 ??C) Oral (!) 49 16 100 %   09/17/17 1345 -- -- -- -- -- 99 %   09/17/17 1330 116/64 -- -- 50 16 100 %   09/17/17 1315 -- -- -- -- -- 99 %         ABDOMEN: normal shape, position and consistency  GENITAL/URINARY:  External Genitalia:  General appearance; normal, Hair distribution; normal, Lesions absent  Uterus:  Size normal, Tenderness  absent  Breast:normal appearance, no masses or tenderness  Cor: RRR no Murmurs  Pulmonary: clear to auscultation anterior and posterior  Extremities: no Clubbing cyanosis or ecchymosis    DATA:    CBC:   Lab Results   Component Value Date    WBC 6.7 09/17/2017    RBC 3.59 09/17/2017    HGB 12.0 09/17/2017    HCT 35.8 09/17/2017    MCV 99.7 09/17/2017    MCH 33.4 09/17/2017    MCHC 33.5 09/17/2017    RDW 11.9 09/17/2017    PLT  181 09/17/2017    MPV 11.2 09/17/2017         ASSESSMENT :      Patient Active Hospital Problem List:   Normal delivery ()     Term pregnancy (09/18/2017)    Assessment: delivered   IUGR, delivered          Plan:  Routine orders and instructions

## 2017-09-18 NOTE — Anesthesia Post-Procedure Evaluation (Signed)
Department of Anesthesiology  Postprocedure Note    Patient: Debbie Brown  MRN: [id number]  Birthdate: 07/18/1980  Date of evaluation: 09/18/2017  Time:  1:58 PM     Procedure Summary     Date:  09/17/17 Room / Location:      Anesthesia Start:  1140 Anesthesia Stop:  1828    Procedure:  Labor Analgesia Diagnosis:      Scheduled Providers:   Responsible Provider:  Truddie CrumbleAshley D Dorkoskie, APRN - CRNA    Anesthesia Type:  epidural ASA Status:  2          Anesthesia Type: epidural    Aldrete Phase I:      Aldrete Phase II:      Last vitals: Reviewed and per EMR flowsheets.       Anesthesia Post Evaluation    Patient location during evaluation: PACU  Patient participation: complete - patient participated  Level of consciousness: awake and alert  Airway patency: patent  Nausea & Vomiting: no nausea and no vomiting  Complications: no  Cardiovascular status: blood pressure returned to baseline and hemodynamically stable  Respiratory status: acceptable and room air  Hydration status: euvolemic  Comments: Patient reports all sensory return to baseline, denies any needs at this time

## 2017-09-18 NOTE — Progress Notes (Signed)
Pt getting ready to eat lunch, asks Rn if lactation consultant can come in to see her at 2:15pm because she is planning to nurse the baby at 2:15pm. Rn tells her that RN will see if lactation consultant is available at that time but if she is gone for the day by then, RN can also assist patient as RN has CLC certification. Pt agreeable. RN checks lactation office and IBCLC is with an outpatient appointment and states she will stop in to see patient. Rn relays this message to the patient.

## 2017-09-19 MED ORDER — IBUPROFEN 800 MG PO TABS
800 MG | ORAL_TABLET | Freq: Three times a day (TID) | ORAL | 1 refills | Status: DC | PRN
Start: 2017-09-19 — End: 2021-05-18

## 2017-09-19 MED FILL — M-M-R II SC INJ: SUBCUTANEOUS | Qty: 0.5

## 2017-09-19 MED FILL — BOOSTRIX 5-2.5-18.5 LF-MCG/0.5 IM SUSP: 5-2.5-18.5 LF-MCG/0.5 | INTRAMUSCULAR | Qty: 0.5

## 2017-09-19 MED FILL — IBUPROFEN 800 MG PO TABS: 800 mg | ORAL | Qty: 1

## 2017-09-19 MED FILL — TYLENOL 325 MG PO TABS: 325 mg | ORAL | Qty: 2

## 2017-09-19 NOTE — Progress Notes (Signed)
Department of Obstetrics and Gynecology  Labor and Delivery       Post Partum Progress Note             SUBJECTIVE:  2nd day postpartum, denies c/o, baby in nursery with apneic episodes, mom is pumping    OBJECTIVE:      Vitals:  BP 124/80    Pulse 50    Temp 98.3 ??F (36.8 ??C) (Temporal)    Resp 14    Ht 5\' 8"  (1.727 m)    Wt 142 lb (64.4 kg)    LMP 12/18/2016    SpO2 94%    Breastfeeding? Unknown    BMI 21.59 kg/m??   Patient Vitals for the past 24 hrs:   BP Temp Temp src Pulse Resp   09/19/17 0053 124/80 98.3 ??F (36.8 ??C) Temporal 50 14   09/18/17 1621 113/71 98 ??F (36.7 ??C) Oral 51 16   09/18/17 1130 127/66 -- -- 64 16         ABDOMEN: normal shape, position and consistency  GENITAL/URINARY:  External Genitalia:  General appearance; normal, Hair distribution; normal, Lesions absent  Uterus:  Size normal, Tenderness absent  Breast:normal appearance, no masses or tenderness  Cor: RRR no Murmurs  Pulmonary: clear to auscultation anterior and posterior  Extremities: no Clubbing cyanosis or ecchymosis    DATA:    CBC:   Lab Results   Component Value Date    WBC 6.7 09/17/2017    RBC 3.59 09/17/2017    HGB 12.0 09/17/2017    HCT 35.8 09/17/2017    MCV 99.7 09/17/2017    MCH 33.4 09/17/2017    MCHC 33.5 09/17/2017    RDW 11.9 09/17/2017    PLT 181 09/17/2017    MPV 11.2 09/17/2017         ASSESSMENT :      Patient Active Hospital Problem List:   Normal delivery ()       Term pregnancy (09/18/2017)    Assessment: delivered              Plan:  D/c possible to border if baby stays, routine instructions, rto 6 weeks

## 2017-09-19 NOTE — Discharge Summary (Signed)
Obstetrical Discharge Form        Gestational Age:5258w0d    Antepartum complications: AMA, IUGR, borderline oligohydramnios    Date of Delivery: 09/17/17    Type of Delivery: NSVD       Delivered By:  Michaelle CopasS. Delayla Hoffmaster CNM    Assisted By:N/A      Baby: female    Anesthesia:  epidural      Intrapartum complications: None    Feeding method: breast    Blood type: O positive    Rubella:  No results found for: RUBG  T. Pallidium, IGG:  No results found for: TREPG  Hepatitis B Surface Antigen: No results found for: HEPBSAG  HIV:  No results found for: HIV12AB    Postpartum complications: none    Discharge Medication:    Debbie Brown, Debbie   Home Medication Instructions [phone]    Printed on:09/19/17 0737   Medication Information                      acyclovir (ZOVIRAX) 400 MG tablet  Take 1 tablet by mouth 2 times daily             Prenatal Vit-Fe Fumarate-FA (PRENATAL VITAMIN) 27-0.8 MG TABS  Take 1 tablet by mouth daily                    Admit date: 09/17/2017  5:59 AM    Discharge Date: 09/19/2017    Discharged to: Home         Plan:   Follow up 6 weeks postpartum visit

## 2017-09-19 NOTE — Discharge Instructions (Signed)
Follow-up with your OB doctor as specified.    [facility] phone: [phone]    Dr. Hedges MD  Kathy Pool CNM  Dr. Doty-Armstrong  Susan Smith CNM  [address]  [place] [address]   [place] [phone]   or [place] [phone]                             DIET   Eat a well balanced diet focusing on foods high in fiber and protein.    Drink plenty of fluids especially water.   To avoid constipation you may take a mild stool softener as recommended by your doctor or midwife.    ACTIVITY   Gradually increase your activity.  Resume exercise regimen only after advice by your doctor or midwife.   Avoid lifting anything heavier than a gallon of milk for SIX weeks.    Avoid driving until your doctor or midwife has given their approval.   Rise slowly from a lying to sitting and then a standing position.   Climb stairs one at a time.  Use caution when carrying your baby up and down the stairs.   NO SEXUAL Activity for 4-6 weeks or until advised by your doctor; Nothing in vagina: intercourse, tampons, or douching.    Be prepared to discuss family planning at your follow-up OB visit.    You may feel tired or have a lack of energy.  You may continue your prenatal vitamin to replenish nutrients post delivery.  Nap when baby naps to catch up on sleep.     EMOTIONS   You may feel moody, sad, teary, & overwhelmed.  Contact your OB provider if you feel you may be showing signs of postpartum depression, or have thoughts of harming yourself or your infant.   If infant will not stop crying, contact another adult for help or place infant in their crib on their back and take a break.  NEVER shake your infant.      BLEEDING   Vaginal bleeding will decrease in amount over the next few weeks.   You will notice that as your activity increases, your flow may increase.  This is your body's way of telling you, you need to take things easier and rest more often.   Call your care provider if you are saturating  more than one maxi pad in an hour & resting does not help.    BREAST CARE   Take medications as recommended by your doctor or midwife for pain   If you develop a warm, red, tender area on your breast or develop a fever contact your OB provider.     For breastfeeding moms:   If you become engorged, feeding may be more difficult or painful for 1-2 days.  You may find it helpful to hand express some milk so that the infant can latch on more easily.    While breastfeeding, continue to take your prenatal vitamins as directed by your doctor or midwife.    Refer to the breastfeeding booklet in the newborn folder/binder for more information.   If you feel you need more assistance or have questions, please call Catherene Arredondo IBCLC, lactation consultant, at [phone] or the OB department to schedule an appointment or phone consultation.   For more FREE breastfeeding help, visit the Breastfeeding Support Group on Monday evenings at 7 pm in the OB department.        For NON-breastfeeding moms:   You may apply ice packs to your breasts over your bra for twenty minutes at a time for comfort.   Avoid stimulation to your breasts, when showering allow the water to strike your back not your breasts.     Wear a good fitting bra until your milk dries, such as a sports bra.    INCISIONAL CARE / PERI CARE   Clean your incision in the shower with mild soap.  After shower pat the incision area dry and allow the area open to air.   If used, Steri-strips should be completely removed by 2 weeks but you may remove them as they become loose or soiled.   If used, Staples should be removed by your care provider.   If used/ordered, an abdominal binder may provide support for your incision.    Use the peri-bottle after toileting until bleeding stops.   Cleanse your perineum from front to back   If used, stitches will dissolve in 4-6 weeks.   You may use a sitz bath or soak in a clean tub as needed for comfort.   Kegel  exercises will help restore bladder control.     SWELLING   Try to keep your legs elevated when you are sitting.    When lying down keep your legs elevated.   When wearing stocking or socks, make sure they are not too tight.    WHEN TO CALL THE DOCTOR   If you have a temp of 100.6 or more.    If your bleeding has increased and you are saturating a pad in an hour.   Your abdomen is tender to touch.   You are passing blood clots bigger than the size of a lemon.   If you are experiencing extreme weakness or dizziness.    If you are having flu-like symptoms such as achy muscles or joints.   There is a foul smell or a green color to your vaginal bleeding.   If you have pain that cannot be relieved.   You have persistent burning or frequency with urination.   Call if you have concerns about your well-being.   You are unable to sleep, eat, or are having thoughts of harming yourself or your baby.    You have swelling, bleeding, drainage, foul odor, redness, or warmth in/around your incision or stitches.   You have a red, warm, tender area in your calf.

## 2017-09-19 NOTE — Plan of Care (Signed)
Problem: Pain:  Description  Pain management should include both nonpharmacologic and pharmacologic interventions.  Goal: Control of acute pain  Description  Control of acute pain  Outcome: Ongoing  Goal: Control of chronic pain  Description  Control of chronic pain  Outcome: Ongoing     Problem: Discharge Planning:  Goal: Discharged to appropriate level of care  Description  Discharged to appropriate level of care  Outcome: Ongoing     Problem: Constipation:  Goal: Bowel elimination is within specified parameters  Description  Bowel elimination is within specified parameters  Outcome: Ongoing     Problem: Mood - Altered:  Goal: Mood stable  Description  Mood stable  Outcome: Ongoing

## 2017-09-19 NOTE — H&P (Signed)
Department of Obstetrics and Gynecology   Obstetrics History and Physical  Debbie HackingSusan M. Channon Brown CNM  H&P Admission Inpatient  Note        CHIEF COMPLAINT:  Here for scheduled induction at term with <10% EFW but >5% and 6.3 cm AFI    HISTORY OF PRESENT ILLNESS:      The patient is a 37 y.o. female at 5864w0d.  OB History     Gravida   3    Para   2    Term   2    Preterm        AB   1    Living   2       SAB   1    TAB        Ectopic        Molar        Multiple   0    Live Births   2            Patient presents with a chief complaint as above and is being admitted for induction    Estimated Due Date: Estimated Date of Delivery: 09/24/17    PRENATAL CARE:    Complicated by: oligohydramnios and IUGR    PAST OB HISTORY:  OB History     Gravida   3    Para   2    Term   2    Preterm        AB   1    Living   2       SAB   1    TAB        Ectopic        Molar        Multiple   0    Live Births   2                Past Medical History:    History reviewed. No pertinent past medical history.  Past Surgical History:        Procedure Laterality Date   ??? APPENDECTOMY  2000     Allergies:  Patient has no known allergies.    Social History:    Social History     Socioeconomic History   ??? Marital status: Married     Spouse name: Not on file   ??? Number of children: Not on file   ??? Years of education: Not on file   ??? Highest education level: Not on file   Occupational History   ??? Not on file   Social Needs   ??? Financial resource strain: Not on file   ??? Food insecurity:     Worry: Not on file     Inability: Not on file   ??? Transportation needs:     Medical: Not on file     Non-medical: Not on file   Tobacco Use   ??? Smoking status: Never Smoker   ??? Smokeless tobacco: Never Used   Substance and Sexual Activity   ??? Alcohol use: Never     Frequency: Never   ??? Drug use: Never   ??? Sexual activity: Yes     Partners: Male   Lifestyle   ??? Physical activity:     Days per week: Not on file     Minutes per session: Not on file   ??? Stress: Not on file    Relationships   ??? Social connections:     Talks  on phone: Not on file     Gets together: Not on file     Attends religious service: Not on file     Active member of club or organization: Not on file     Attends meetings of clubs or organizations: Not on file     Relationship status: Not on file   ??? Intimate partner violence:     Fear of current or ex partner: Not on file     Emotionally abused: Not on file     Physically abused: Not on file     Forced sexual activity: Not on file   Other Topics Concern   ??? Not on file   Social History Narrative   ??? Not on file     Family History:       Problem Relation Age of Onset   ??? Colon Cancer Paternal Grandfather    ??? No Known Problems Paternal Grandmother    ??? Mental Illness Maternal Grandmother    ??? No Known Problems Father    ??? No Known Problems Mother    ??? No Known Problems Brother    ??? No Known Problems Sister    ??? No Known Problems Sister      Medications Prior to Admission:  No medications prior to admission.    REVIEW OF SYSTEMS:    CONSTITUTIONAL:  negative  RESPIRATORY:  negative  CARDIOVASCULAR:  negative  GASTROINTESTINAL:  negative  ALLERGIC/IMMUNOLOGIC:  negative  NEUROLOGICAL:  negative  BEHAVIOR/PSYCH:  negative    PHYSICAL EXAM:  Vitals:    09/18/17 1130 09/18/17 1621 09/19/17 0053 09/19/17 0738   BP: 127/66 113/71 124/80 115/74   Pulse: 64 51 50 68   Resp: 16 16 14 16    Temp:  98 ??F (36.7 ??C) 98.3 ??F (36.8 ??C) 97.4 ??F (36.3 ??C)   TempSrc:  Oral Temporal Oral   SpO2:       Weight:       Height:         General appearance:  awake, alert, cooperative, no apparent distress, and appears stated age  Neurologic:  Awake, alert, oriented to name, place and time.    Lungs:  No increased work of breathing, good air exchange  Abdomen:  Soft, non tender, gravid, consistent with her gestational age, EFW by sono was 5 1/2 lbs   Fetal heart rate:  Reassuring.  Pelvis:  Adequate pelvis  Cervix: 2 cm   Contraction frequency:  irregular minutes    Membranes:  Intact    Labs:    CBC:   Lab Results   Component Value Date    WBC 6.7 09/17/2017    RBC 3.59 09/17/2017    HGB 12.0 09/17/2017    HCT 35.8 09/17/2017    MCV 99.7 09/17/2017    MCH 33.4 09/17/2017    MCHC 33.5 09/17/2017    RDW 11.9 09/17/2017    PLT 181 09/17/2017    MPV 11.2 09/17/2017       ASSESSMENT AND PLAN:    Estimated length of stay: 2 days    Patient Active Hospital Problem List:    Multigravida, term pregnancy, oligo hydramnios and iugr      Labor: Admit, anticipate normal delivery, routine labor orders  Fetus: Reassuring, Cat 1  GBS: No  Other: pitocin, arom with consult Dr. Claiborne Rigg. Indigo Chaddock,CNM,09/27/2017 5:41 PM

## 2017-09-20 ENCOUNTER — Encounter

## 2017-09-26 ENCOUNTER — Encounter

## 2017-10-04 NOTE — Progress Notes (Signed)
Discharge Phone Call Log    Patient Name: Debbie Brown     [facility] Care Provider: No admitting provider for patient encounter. Most Recent Discharge Date: 09/19/17      Disposition of baby:    Phone Number: [phone] (home)     Call made 10/04/2017 12:29 PM        [x]  Spoke with patient.  Mom and baby are doing well: denies needs.    [x]  Understood discharge instruction and had appropriate follow up care.     [x]  Had a good hospital experience.    []  Gave compliments -

## 2017-10-30 ENCOUNTER — Encounter

## 2017-11-01 ENCOUNTER — Other Ambulatory Visit: Admit: 2017-11-01 | Discharge: 2017-11-01 | Payer: Commercial Managed Care - PPO

## 2017-11-01 LAB — POCT HEMOGLOBIN: Hemoglobin: 12.6

## 2017-11-01 NOTE — Progress Notes (Signed)
POSTPARTUM EXAM    Date of service: 11/01/2017    Debbie Pentonmily J Sparks  Is a 37 y.o. married female    PT's PCP is: No primary care provider on file.     DOB: 11/22/1980     OB History   Gravida Para Term Preterm AB Living   3 2 2   1 2    SAB TAB Ectopic Molar Multiple Live Births   1       0 2      # Outcome Date GA Lbr Len/2nd Weight Sex Delivery Anes PTL Lv   3 Term 09/17/17 5766w0d / 00:28 5 lb 4.8 oz (2.404 kg) F Vag-Spont EPI N LIV   2 Term 02/11/14 3952w3d  6 lb 7 oz (2.92 kg) M  EPI N LIV   1 SAB 2014 1978w0d               Social History     Tobacco Use   Smoking Status Never Smoker   Smokeless Tobacco Never Used         Social History     Substance and Sexual Activity   Alcohol Use Yes   ??? Frequency: Never    Comment: social         Delivery date 09/17/2017    Type of delivery:  Spontaneous vaginal delivery    Laceration:Yes,     Infant gender: female    Infant name: Karren BurlyLennox    Are you breast or bottle feeding?  Breast    Have you been sexually active since delivery? No    Vital Signs: Blood pressure 114/64, height 5\' 8"  (1.727 m), weight 127 lb 9.6 oz (57.9 kg), last menstrual period 12/18/2016, currently breastfeeding.     Labs:    Blood Type and Rh: O positive          Allergies: Patient has no known allergies.      Current Outpatient Medications:   ???  acyclovir (ZOVIRAX) 400 MG tablet, Take 1 tablet by mouth 2 times daily, Disp: 60 tablet, Rfl: 1  ???  Prenatal Vit-Fe Fumarate-FA (PRENATAL VITAMIN) 27-0.8 MG TABS, Take 1 tablet by mouth daily, Disp: , Rfl:   ???  ibuprofen (ADVIL;MOTRIN) 800 MG tablet, Take 1 tablet by mouth every 8 hours as needed for Pain (Patient not taking: Reported on 11/01/2017), Disp: 50 tablet, Rfl: 1    Last Yearly: 05/2016    Last pap: 05/2016    Last HPV: na    Chief Complaint   Patient presents with   ??? Postpartum Care     Post partum exam. Patient had SVD 09/17/2017.        How many Hours of sleep do you get a night: 5-6  Do you have a normal appetite: yes    Any problems or pain: no    Do  you feel like you coping well: yes    Is baby sleeping:good    How is baby eating: good    How did first pediatrician visit go: good    EPPDS:7    Results for orders placed or performed in visit on 11/01/17   POCT hemoglobin   Result Value Ref Range    Hemoglobin 12.6          Nurse: C.Alaysia Lightle LPN    HPI:  PT presents for Post partum exam Follow up 6 weeks after delivery.      Objective  Lymphatic:   no lymphadenopathy     GI: Abdomen  soft, non-tender. BS normal. No masses,  No organomegaly           Extremities: normal strength, tone, and muscle mass   Breasts: Breast:lactating   Pelvic Exam: GENITAL/URINARY:  External Genitalia:  General appearance; normal, Hair distribution; normal, Lesions absent, well healed                                      Assessment and Plan          Diagnosis Orders   1. Postpartum care and examination  POCT hemoglobin   2. Postpartum care following vaginal delivery     3. Lactation disorder               I am having Debbie Brown. Meyerhoff maintain her Prenatal Vitamin, acyclovir, and ibuprofen.    Return in about 6 months (around 05/04/2018) for yearly.    There are no Patient Instructions on file for this visit.    Over 50% of time spent on counseling and care coordination on: see assessment and plan,  She was also counseled on her preventative health maintenance recommendations and follow-up.        FF time: 15 min      Venia Minks Jalyiah Shelley,11/01/2017 1:58 PM

## 2017-11-01 NOTE — Progress Notes (Deleted)
Subjective:      Patient ID: Debbie Brown is a 37 y.o. female.    HPI    Review of Systems    Objective:   Physical Exam    Assessment:      ***      Plan:      ***        Simonne MartinetSusan Michelle Perlie Scheuring, APRN - CNM

## 2018-05-09 ENCOUNTER — Inpatient Hospital Stay: Payer: PRIVATE HEALTH INSURANCE

## 2018-05-09 ENCOUNTER — Encounter

## 2018-05-09 ENCOUNTER — Ambulatory Visit: Admit: 2018-05-09 | Discharge: 2018-05-09 | Payer: PRIVATE HEALTH INSURANCE

## 2018-05-09 DIAGNOSIS — Z01419 Encounter for gynecological examination (general) (routine) without abnormal findings: Secondary | ICD-10-CM

## 2018-05-09 NOTE — Progress Notes (Signed)
YEARLY PHYSICAL    Date of service: 05/09/2018    Debbie Brown  Is a 38 y.o.  married female    PT's PCP is: No primary care provider on file.     DOB: 1980/10/11                                             Subjective:       No LMP recorded.     Are your menses regular: no    OB History   Gravida Para Term Preterm AB Living   3 2 2   1 2    SAB TAB Ectopic Molar Multiple Live Births   1       0 2      # Outcome Date GA Lbr Len/2nd Weight Sex Delivery Anes PTL Lv   3 Term 09/17/17 [redacted]w[redacted]d / 00:28 5 lb 4.8 oz (2.404 kg) F Vag-Spont EPI N LIV   2 Term 02/11/14 [redacted]w[redacted]d  6 lb 7 oz (2.92 kg) M  EPI N LIV   1 SAB 2014 [redacted]w[redacted]d               Social History     Tobacco Use   Smoking Status Never Smoker   Smokeless Tobacco Never Used        Social History     Substance and Sexual Activity   Alcohol Use Yes   ??? Frequency: Never    Comment: social       Family History   Problem Relation Age of Onset   ??? Colon Cancer Paternal Grandfather    ??? No Known Problems Paternal Grandmother    ??? Mental Illness Maternal Grandmother    ??? No Known Problems Father    ??? No Known Problems Mother    ??? No Known Problems Brother    ??? No Known Problems Sister    ??? No Known Problems Sister    ??? Other Other         No family h/o ovarian or breast cancer .No family h/o DVT. Marland Kitchen        Any family history of breast or ovarian cancer: No    Any family history of blood clots: No      Allergies: Patient has no known allergies.      Current Outpatient Medications:   ???  ibuprofen (ADVIL;MOTRIN) 800 MG tablet, Take 1 tablet by mouth every 8 hours as needed for Pain (Patient not taking: Reported on 11/01/2017), Disp: 50 tablet, Rfl: 1  ???  acyclovir (ZOVIRAX) 400 MG tablet, Take 1 tablet by mouth 2 times daily (Patient not taking: Reported on 05/09/2018), Disp: 60 tablet, Rfl: 1  ???  Prenatal Vit-Fe Fumarate-FA (PRENATAL VITAMIN) 27-0.8 MG TABS, Take 1 tablet by mouth daily, Disp: , Rfl:     Social History      Substance and Sexual Activity   Sexual Activity Yes   ??? Partners: Male       Any bleeding or pain with intercourse: No    Last Yearly:  2018    Last pap: 2018    Last HPV: ?    Last Mammogram: na    Last Dexascan na    Last colorectal screen type:na*  date  na    Do you do self breast exams: No    Past Medical History:   Diagnosis  Date   ??? Herpes        Past Surgical History:   Procedure Laterality Date   ??? APPENDECTOMY  2000       Family History   Problem Relation Age of Onset   ??? Colon Cancer Paternal Grandfather    ??? No Known Problems Paternal Grandmother    ??? Mental Illness Maternal Grandmother    ??? No Known Problems Father    ??? No Known Problems Mother    ??? No Known Problems Brother    ??? No Known Problems Sister    ??? No Known Problems Sister    ??? Other Other         No family h/o ovarian or breast cancer .No family h/o DVT. Marland Kitchen        Chief Complaint   Patient presents with   ??? Gynecologic Exam     Yearly exam. Last pap 05/2016 negative, HPV ? Prescription for generic Valtex. Patient is nursing 33 month old and has not had period yet.           PE:  Vital Signs  Blood pressure 102/66, height 5\' 8"  (1.727 m), weight 116 lb (52.6 kg), currently breastfeeding.     Labs:    No results found for this visit on 05/09/18.      NURSE: C.Argel Pablo LPN    HPI: here for annual exam, continues breastfeeding 26 month old, no menses yet    Review of Systems   Constitutional: Negative.    HENT: Negative for congestion and sore throat.    Respiratory: Negative for shortness of breath.    Cardiovascular: Negative for chest pain.   Gastrointestinal: Negative for abdominal pain.   Genitourinary: Negative for dysuria.   Musculoskeletal: Negative for back pain.   Skin: Negative for rash.   Neurological: Negative for headaches.   Psychiatric/Behavioral: The patient is not nervous/anxious.          Objective  Lymphatic:   no lymphadenopathy  Heent:   negative   Cor: regular rate and rhythm, no murmurs              MIW:OEHOZ to  auscultation bilaterally- no wheezes, rales or rhonchi, normal air movement, no respiratory distress      GI: Abdomen soft, non-tender. BS normal. No masses,  No organomegaly           Extremities: normal strength, tone, and muscle mass   Breasts: Breast:normal appearance, no masses or tenderness; lactating   Pelvic Exam: GENITAL/URINARY:  External Genitalia:  General appearance; normal, Hair distribution; normal, Lesions absent  Urethral Meatus:  Size normal, Location normal, Lesions absent, Prolapse absent  Urethra:  Fullness absent, Masses absent  Bladder:  Fullness absent, Masses absent, Tenderness absent, Cystocele absent  Vagina:  General appearance normal, Estrogen effect normal, Discharge absent, Lesions absent, Pelvic support normal  Cervix:  General appearance normal, Lesions absent, Discharge absent, Tenderness absent, Enlargement absent, Nodularity absent  Uterus:  Size normal, Tenderness absent  Adenexa:  Masses absent, Tenderness absent  Anus/Perineum:  Lesions absent and Masses absent                                                         Assessment and Plan          Diagnosis Orders  1. Encounter for annual routine gynecological examination  PAP SMEAR         if no menses after a year, patient desires either ocps or withdrawal bleed    I am having Debbie Brown maintain her Prenatal Vitamin, acyclovir, and ibuprofen.    Return in about 1 year (around 05/10/2019) for yearly.    She was also counseled on her preventative health maintenance recommendations and follow-up.     There are no Patient Instructions on file for this visit.    Debbie MinksSusan Michelle Braydn Brown,05/09/2018 9:01 AM

## 2018-05-09 NOTE — Other (Signed)
Pt. notified of results per letter and recall added.

## 2018-05-09 NOTE — Addendum Note (Signed)
Addended by: Ananias Pilgrim on: 05/09/2018 09:38 AM     Modules accepted: Orders

## 2018-05-18 LAB — GYN CYTOLOGY

## 2018-08-31 ENCOUNTER — Inpatient Hospital Stay
Admit: 2018-08-31 | Discharge: 2018-09-01 | Disposition: A | Discharge status: Home / Self Care | Payer: PRIVATE HEALTH INSURANCE | Attending: Emergency Medicine

## 2018-08-31 DIAGNOSIS — S91202A Unspecified open wound of left great toe with damage to nail, initial encounter: Secondary | ICD-10-CM

## 2018-08-31 MED ORDER — LIDOCAINE HCL (PF) 2 % IJ SOLN
2 % | Freq: Once | INTRAMUSCULAR | Status: AC
Start: 2018-08-31 — End: 2018-08-31
  Administered 2018-09-01: 20 mL via INTRADERMAL

## 2018-08-31 MED ORDER — BUPIVACAINE HCL (PF) 0.5 % IJ SOLN
0.5 % | Freq: Once | INTRAMUSCULAR | Status: AC
Start: 2018-08-31 — End: 2018-08-31
  Administered 2018-09-01: 50 mL via INTRADERMAL

## 2018-08-31 MED FILL — XYLOCAINE-MPF 2 % IJ SOLN: 2 % | INTRAMUSCULAR | Qty: 20

## 2018-08-31 MED FILL — BUPIVACAINE HCL (PF) 0.5 % IJ SOLN: 0.5 % | INTRAMUSCULAR | Qty: 30

## 2018-08-31 NOTE — ED Provider Notes (Signed)
[facility]  eMERGENCY dEPARTMENT eNCOUnter      Pt Name: Debbie Brown Kendra  MRN: [id number]  Birthdate 10/22/1980  Date of evaluation: 08/31/2018  Provider: Susie CassetteJUSTIN Tannya Gonet, DO     CHIEF COMPLAINT       Chief Complaint   Patient presents with   ??? Toe Pain     Left big toe--pulled a box on to it and detached part of the the nail         HISTORY OF PRESENT ILLNESS   (Location/Symptom, Timing/Onset, Context/Setting, Quality, Duration, Modifying Factors, Severity) Note limiting factors.   HPI    Debbie Brown Prime is a 38 y.o. female who presents to the emergency department with left great toe pain.  Patient states approximate 1 to 2 hours prior to arrival she was moving a table in her house when part of it hit her in the left foot.  She states it "tore part of her toenail".  She states she has had bleeding and pain since that time and has concerned her toenail may need to be removed.  Secondary to this she presents for evaluation.     Nursing Notes were reviewed.    REVIEW OF SYSTEMS    (2+ for level 4; 10+ for level 5)   Review of Systems   Constitutional: Negative for chills and fever.   HENT: Negative for congestion and sore throat.    Respiratory: Negative for cough and shortness of breath.    Gastrointestinal: Negative for nausea and vomiting.   Musculoskeletal: Negative for gait problem.        Positive left foot/toe pain   Skin: Positive for wound.   Allergic/Immunologic: Negative for immunocompromised state.   Neurological: Negative for weakness and numbness.   All other systems reviewed and are negative.      PAST MEDICAL HISTORY     Past Medical History:   Diagnosis Date   ??? Herpes        SURGICAL HISTORY       Past Surgical History:   Procedure Laterality Date   ??? APPENDECTOMY  2000       CURRENT MEDICATIONS       Previous Medications    ACYCLOVIR (ZOVIRAX) 400 MG TABLET    Take 1 tablet by mouth 2 times daily    IBUPROFEN (ADVIL;MOTRIN) 800 MG TABLET    Take 1 tablet by mouth every 8 hours as needed for Pain     PRENATAL VIT-FE FUMARATE-FA (PRENATAL VITAMIN) 27-0.8 MG TABS    Take 1 tablet by mouth daily       ALLERGIES     Patient has no known allergies.    FAMILY HISTORY       Family History   Problem Relation Age of Onset   ??? Colon Cancer Paternal Grandfather    ??? No Known Problems Paternal Grandmother    ??? Mental Illness Maternal Grandmother    ??? No Known Problems Father    ??? No Known Problems Mother    ??? No Known Problems Brother    ??? No Known Problems Sister    ??? No Known Problems Sister    ??? Other Other         No family h/o ovarian or breast cancer .No family h/o DVT. Marland Kitchen.         SOCIAL HISTORY       Social History     Socioeconomic History   ??? Marital status: Married  Spouse name: None   ??? Number of children: None   ??? Years of education: None   ??? Highest education level: None   Occupational History   ??? None   Social Needs   ??? Financial resource strain: None   ??? Food insecurity     Worry: None     Inability: None   ??? Transportation needs     Medical: None     Non-medical: None   Tobacco Use   ??? Smoking status: Never Smoker   ??? Smokeless tobacco: Never Used   Substance and Sexual Activity   ??? Alcohol use: Yes     Frequency: Never     Comment: social   ??? Drug use: Never   ??? Sexual activity: Yes     Partners: Male   Lifestyle   ??? Physical activity     Days per week: None     Minutes per session: None   ??? Stress: None   Relationships   ??? Social Wellsite geologistconnections     Talks on phone: None     Gets together: None     Attends religious service: None     Active member of club or organization: None     Attends meetings of clubs or organizations: None     Relationship status: None   ??? Intimate partner violence     Fear of current or ex partner: None     Emotionally abused: None     Physically abused: None     Forced sexual activity: None   Other Topics Concern   ??? None   Social History Narrative   ??? None       SCREENINGS           PHYSICAL EXAM    (up to 7 for level 4, 8 or more for level 5)   @[email or web address]     Physical  Exam  Vitals signs and nursing note reviewed.   Constitutional:       General: She is not in acute distress.     Appearance: Normal appearance. She is not ill-appearing, toxic-appearing or diaphoretic.   HENT:      Head: Normocephalic and atraumatic.   Eyes:      General: No scleral icterus.        Right eye: No discharge.         Left eye: No discharge.      Extraocular Movements: Extraocular movements intact.      Conjunctiva/sclera: Conjunctivae normal.      Pupils: Pupils are equal, round, and reactive to light.   Neck:      Musculoskeletal: Normal range of motion and neck supple. No neck rigidity or muscular tenderness.   Cardiovascular:      Rate and Rhythm: Normal rate and regular rhythm.      Pulses: Normal pulses.      Heart sounds: Normal heart sounds. No murmur. No friction rub. No gallop.    Pulmonary:      Effort: Pulmonary effort is normal. No respiratory distress.      Breath sounds: Normal breath sounds. No wheezing, rhonchi or rales.   Musculoskeletal: Normal range of motion.         General: Tenderness and signs of injury present. No swelling or deformity.      Comments: Left lower extremity is neurovascularly intact.  The left great toenail has been partially avulsed and is lifted/elevated off the nailbed.  There is minimal ooze of blood.  Otherwise there  is no bony deformity or joint effusion.  No ligamentous laxity.  Patient is full active range of motion   Skin:     General: Skin is warm and dry.      Capillary Refill: Capillary refill takes less than 2 seconds.      Findings: No erythema or rash.   Neurological:      General: No focal deficit present.      Mental Status: She is alert and oriented to person, place, and time.      Cranial Nerves: No cranial nerve deficit.      Sensory: No sensory deficit.   Psychiatric:         Mood and Affect: Mood normal.         Behavior: Behavior normal.         Thought Content: Thought content normal.         Judgment: Judgment normal.         DIAGNOSTIC  RESULTS     EKG (Per Emergency Physician):       RADIOLOGY (Per Emergency Physician):       Interpretation per the Radiologist below, if available at the time of this note:  No results found.    ED BEDSIDE ULTRASOUND:   Performed by ED Physician - none    LABS:  Labs Reviewed - No data to display     All other labs were within normal range or not returned as of this dictation.    EMERGENCY DEPARTMENT COURSE and DIFFERENTIAL DIAGNOSIS/MDM:   Vitals:    Vitals:    08/31/18 1827   BP: 98/71   Pulse: 52   Resp: 18   Temp: 98.1 ??F (36.7 ??C)   TempSrc: Tympanic   SpO2: 100%   Weight: 115 lb (52.2 kg)   Height: 5\' 8"  (1.727 m)       Medications   lidocaine PF 2 % injection 20 mL (20 mLs Intradermal Given 08/31/18 2010)   bupivacaine (PF) (MARCAINE) 0.5 % injection 50 mg (50 mg Intradermal Given 08/31/18 2010)       MDM.  Patient had direct trauma to her left great toe with partial nail avulsion.  At this time there is no ligamentous laxity or bony deformity suggest other trauma so do not feel there is need for an x-ray.  The patient had the toenail removed as documented procedure section.  At this time there is low risk for infection and there is no obvious nailbed laceration so patient can be discharged home.     REVAL:         CRITICAL CARE TIME   Total Critical Care time was minutes, excluding separately reportable procedures.  There was a high probability of clinically significant/life threatening deterioration in the patient's condition which required my urgent intervention.     CONSULTS:  None    PROCEDURES:  Unless otherwise noted below, none     Procedures    Patient was given a digital block to her left great toe using 3 mL's of 2% lidocaine without epinephrine and 3 mL's of 0.5% Marcaine and digital block fashion.  Patient had the area cleaned with Betadine.  Scissors and forceps were then used to remove the remainder of the toenail.  Evaluation of the nailbed showed no obvious nailbed laceration.  Patient tolerated  procedure well and there were no complications    FINAL IMPRESSION      1. Toenail avulsion, initial encounter  DISPOSITION/PLAN   DISPOSITION Decision To Discharge 08/31/2018 08:28:58 PM      PATIENT REFERRED TO:  [facility]  [address]  [place] [phone]  [phone]  Schedule an appointment as soon as possible for a visit in 1 week  If symptoms worsen      DISCHARGE MEDICATIONS:  New Prescriptions    No medications on file          (Please note:  Portions of this note were completed with a voice recognition program.  Efforts were made to edit the dictations but occasionally words and phrases are mis-transcribed.)  Form [id number].[id number]    Susie CassetteJUSTIN Allayah Raineri, DO (electronically signed)  Emergency Medicine Provider        [place] Farms[place] Zaeda Mcferran, DO  08/31/18 2347

## 2021-05-18 ENCOUNTER — Inpatient Hospital Stay: Payer: PRIVATE HEALTH INSURANCE

## 2021-05-18 ENCOUNTER — Ambulatory Visit: Admit: 2021-05-18 | Discharge: 2021-05-18 | Payer: PRIVATE HEALTH INSURANCE

## 2021-05-18 DIAGNOSIS — Z124 Encounter for screening for malignant neoplasm of cervix: Secondary | ICD-10-CM

## 2021-05-18 DIAGNOSIS — Z01419 Encounter for gynecological examination (general) (routine) without abnormal findings: Secondary | ICD-10-CM

## 2021-05-18 LAB — TSH: TSH: 1.01 u[IU]/mL (ref 0.30–5.00)

## 2021-05-18 NOTE — Other (Signed)
Left message for pt. To call office for results.

## 2021-05-18 NOTE — Progress Notes (Signed)
YEARLY PHYSICAL    Date of service: 05/18/2021    Debbie Brown  Is a 41 y.o.  married, female    PT's PCP is: No primary care provider on file.     DOB: 1980/06/06                                             Subjective:       Patient's last menstrual period was 07/03/2020 (approximate).     Are your menses regular: no    OB History   Gravida Para Term Preterm AB Living   3 2 2   1 2    SAB IAB Ectopic Molar Multiple Live Births   1       0 2      # Outcome Date GA Lbr Len/2nd Weight Sex Delivery Anes PTL Lv   3 Term 09/17/17 [redacted]w[redacted]d / 00:28 5 lb 4.8 oz (2.404 kg) F Vag-Spont EPI N LIV   2 Term 02/11/14 [redacted]w[redacted]d  6 lb 7 oz (2.92 kg) M  EPI N LIV   1 SAB 2014 [redacted]w[redacted]d               Social History     Tobacco Use   Smoking Status Never   Smokeless Tobacco Never        Social History     Substance and Sexual Activity   Alcohol Use Yes    Comment: social       Family History   Problem Relation Age of Onset    No Known Problems Mother     Other Father         mental illness    No Known Problems Sister     No Known Problems Sister     No Known Problems Brother     Mental Illness Maternal Grandmother     No Known Problems Paternal Grandmother     Colon Cancer Paternal Grandfather     Other Other         No family h/o ovarian or breast cancer .No family h/o DVT. [redacted]w[redacted]d        Any family history of breast or ovarian cancer: No    Any family history of blood clots: No    Have you had a positive covid test: No    Have you had the covid immunization: Yes      Allergies: Patient has no known allergies.    No current outpatient medications on file.    Social History     Substance and Sexual Activity   Sexual Activity Yes    Partners: Male       Any bleeding or pain with intercourse: No    Last Yearly date:  05/09/2018    Last pap date and results: 05/09/2018 negative    Last HPV date and results: unknown    Last Mammogram date and results: never    Last Dexa scan date and results:  never    Last colorectal screen- type:never*  date  never results never    Do you do self breast exams: No    Past Medical History:   Diagnosis Date    Herpes        Past Surgical History:   Procedure Laterality Date    APPENDECTOMY  2000       Family History   Problem  Relation Age of Onset    No Known Problems Mother     Other Father         mental illness    No Known Problems Sister     No Known Problems Sister     No Known Problems Brother     Mental Illness Maternal Grandmother     No Known Problems Paternal Grandmother     Colon Cancer Paternal Grandfather     Other Other         No family h/o ovarian or breast cancer .No family h/o DVT. Marland Kitchen        Chief Complaint   Patient presents with    Gynecologic Exam     Yearly exam. Last pap 05/09/2018 negative, HPV unknown. Patient has irregular periods, LMP 06/2020. Spotting in November.           PE:  Vital Signs  Blood pressure 110/68, height 5\' 8"  (1.727 m), weight 114 lb (51.7 kg), last menstrual period 07/03/2020, not currently breastfeeding.  Estimated body mass index is 17.33 kg/m?? as calculated from the following:    Height as of this encounter: 5\' 8"  (1.727 m).    Weight as of this encounter: 114 lb (51.7 kg).    Labs:    No results found for this visit on 05/18/21.    No data recorded    NURSE: Debbie Donegan LPN    HPI: here for annual gyn exam, irregular periods (space) feels its related to her physical activity/ work outs    Review of Systems   Constitutional: Negative.    HENT:  Negative for congestion.    Respiratory:  Negative for shortness of breath.    Cardiovascular:  Negative for chest pain.   Gastrointestinal:  Negative for abdominal pain.   Genitourinary:  Negative for dysuria.   Musculoskeletal:  Negative for back pain.   Skin:  Negative for rash.   Neurological:  Negative for headaches.   Psychiatric/Behavioral:  The patient is not nervous/anxious.        Objective  Lymphatic:   no lymphadenopathy  Heent:   negative   Cor: regular rate and rhythm, no  murmurs              [id number] to auscultation bilaterally- no wheezes, rales or rhonchi, normal air movement, no respiratory distress      GI: Abdomen soft, non-tender. BS normal. No masses,  No organomegaly           Extremities: normal strength, tone, and muscle mass   Breasts: Breast:normal appearance, no masses or tenderness   Pelvic Exam: GENITAL/URINARY:  External Genitalia:  General appearance; normal, Hair distribution; normal, Lesions absent  Urethral Meatus:  Size normal, Location normal, Lesions absent, Prolapse absent  Urethra:  Fullness absent, Masses absent  Bladder:  Fullness absent, Masses absent, Tenderness absent, Cystocele absent  Vagina:  General appearance normal, Estrogen effect normal, Discharge absent, Lesions absent, Pelvic support normal  Cervix:  General appearance normal, Lesions absent, Discharge absent, Tenderness absent, Enlargement absent, Nodularity absent  Uterus:  Size normal, Tenderness absent  Adenexa:  Masses absent, Tenderness absent  Anus/Perineum:  Lesions absent and Masses absent                                  Assessment and Plan          Diagnosis Orders   1. Encounter for annual routine gynecological examination  2. Secondary amenorrhea  Follicle Stimulating Hormone    Luteinizing Hormone    Estradiol    TSH      3. Screening for malignant neoplasm of cervix  PAP SMEAR          patient has low bmi and exercises "a lot" is not worried about not having menses feels that it is from that, does not plan on any more children.  Reviewed risks of EM thickening/ hyperplasia, would like to avoid medical costs of an ultrasound today, will check menopausal labs but explained to patient I will likely recommend that       I have discontinued Debbie Brown. Debbie Brown's Prenatal Vitamin and ibuprofen.    Return in about 1 year (around 05/18/2022) for yearly.    There are no Patient Instructions on file for this visit.          Simonne Martinet, APRN - CNM,05/21/2021 5:51 PM

## 2021-05-18 NOTE — Other (Signed)
Pt. notified of results per letter and recall added.

## 2021-05-18 NOTE — Other (Signed)
Pt. notified of results and voices understanding. Order added for repeat lab in 1 month.

## 2021-05-19 LAB — ESTRADIOL: Estradiol: 9.2 pg/mL — ABNORMAL LOW (ref 27–314)

## 2021-05-19 LAB — FOLLICLE STIMULATING HORMONE: FSH: 7.8 m[IU]/mL (ref 1.7–21.5)

## 2021-05-19 LAB — LUTEINIZING HORMONE: LH: 1.9 m[IU]/mL (ref 1.0–95.6)

## 2021-05-24 ENCOUNTER — Encounter

## 2021-05-25 ENCOUNTER — Encounter

## 2021-05-27 LAB — GYN CYTOLOGY
